# Patient Record
Sex: Male | Born: 1955 | Race: Black or African American | Hispanic: No | Marital: Married | Smoking: Never smoker
Health system: Southern US, Community
[De-identification: ages and names within clinical notes are randomized; demographics above are authoritative.]

## PROBLEM LIST (undated history)

## (undated) DIAGNOSIS — K219 Gastro-esophageal reflux disease without esophagitis: Secondary | ICD-10-CM

## (undated) DIAGNOSIS — J45909 Unspecified asthma, uncomplicated: Secondary | ICD-10-CM

## (undated) HISTORY — DX: Gastro-esophageal reflux disease without esophagitis: K21.9

## (undated) HISTORY — PX: PILONIDAL CYST EXCISION: SHX744

---

## 1997-09-10 ENCOUNTER — Emergency Department (HOSPITAL_COMMUNITY): Admission: EM | Admit: 1997-09-10 | Discharge: 1997-09-10 | Payer: Self-pay | Admitting: Emergency Medicine

## 2012-03-09 ENCOUNTER — Encounter (HOSPITAL_COMMUNITY): Payer: Self-pay | Admitting: *Deleted

## 2012-03-09 ENCOUNTER — Emergency Department (HOSPITAL_COMMUNITY)
Admission: EM | Admit: 2012-03-09 | Discharge: 2012-03-09 | Disposition: A | Discharge status: Home / Self Care | Payer: No Typology Code available for payment source | Attending: Emergency Medicine | Admitting: Emergency Medicine

## 2012-03-09 DIAGNOSIS — S46909A Unspecified injury of unspecified muscle, fascia and tendon at shoulder and upper arm level, unspecified arm, initial encounter: Secondary | ICD-10-CM | POA: Insufficient documentation

## 2012-03-09 DIAGNOSIS — S8990XA Unspecified injury of unspecified lower leg, initial encounter: Secondary | ICD-10-CM | POA: Insufficient documentation

## 2012-03-09 DIAGNOSIS — Y9389 Activity, other specified: Secondary | ICD-10-CM | POA: Insufficient documentation

## 2012-03-09 DIAGNOSIS — S4980XA Other specified injuries of shoulder and upper arm, unspecified arm, initial encounter: Secondary | ICD-10-CM | POA: Insufficient documentation

## 2012-03-09 DIAGNOSIS — J45909 Unspecified asthma, uncomplicated: Secondary | ICD-10-CM | POA: Insufficient documentation

## 2012-03-09 DIAGNOSIS — S0993XA Unspecified injury of face, initial encounter: Secondary | ICD-10-CM | POA: Insufficient documentation

## 2012-03-09 DIAGNOSIS — Z7982 Long term (current) use of aspirin: Secondary | ICD-10-CM | POA: Insufficient documentation

## 2012-03-09 DIAGNOSIS — M7918 Myalgia, other site: Secondary | ICD-10-CM

## 2012-03-09 DIAGNOSIS — Z79899 Other long term (current) drug therapy: Secondary | ICD-10-CM | POA: Insufficient documentation

## 2012-03-09 DIAGNOSIS — S99929A Unspecified injury of unspecified foot, initial encounter: Secondary | ICD-10-CM | POA: Insufficient documentation

## 2012-03-09 DIAGNOSIS — Y92410 Unspecified street and highway as the place of occurrence of the external cause: Secondary | ICD-10-CM | POA: Insufficient documentation

## 2012-03-09 HISTORY — DX: Unspecified asthma, uncomplicated: J45.909

## 2012-03-09 MED ORDER — DIAZEPAM 5 MG PO TABS: 5.0000 mg | ORAL_TABLET | Freq: Three times a day (TID) | ORAL | Status: DC | PRN
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
## 2012-03-09 NOTE — ED Provider Notes (Signed)
Medical screening examination/treatment/procedure(s) were performed by non-physician practitioner and as supervising physician I was immediately available for consultation/collaboration.   Carleene Cooper III, MD 03/09/12 2023

## 2013-02-23 ENCOUNTER — Emergency Department (HOSPITAL_BASED_OUTPATIENT_CLINIC_OR_DEPARTMENT_OTHER)
Admission: EM | Admit: 2013-02-23 | Discharge: 2013-02-23 | Disposition: A | Discharge status: Home / Self Care | Payer: 59 | Attending: Emergency Medicine | Admitting: Emergency Medicine

## 2013-02-23 ENCOUNTER — Encounter (HOSPITAL_BASED_OUTPATIENT_CLINIC_OR_DEPARTMENT_OTHER): Payer: Self-pay | Admitting: Emergency Medicine

## 2013-02-23 DIAGNOSIS — Z79899 Other long term (current) drug therapy: Secondary | ICD-10-CM | POA: Insufficient documentation

## 2013-02-23 DIAGNOSIS — Z792 Long term (current) use of antibiotics: Secondary | ICD-10-CM | POA: Insufficient documentation

## 2013-02-23 DIAGNOSIS — B029 Zoster without complications: Secondary | ICD-10-CM | POA: Insufficient documentation

## 2013-02-23 DIAGNOSIS — Z7982 Long term (current) use of aspirin: Secondary | ICD-10-CM | POA: Insufficient documentation

## 2013-02-23 DIAGNOSIS — J45909 Unspecified asthma, uncomplicated: Secondary | ICD-10-CM | POA: Insufficient documentation

## 2013-02-23 MED ORDER — VALACYCLOVIR HCL 1 G PO TABS: 1000.0000 mg | ORAL_TABLET | Freq: Three times a day (TID) | ORAL | Status: DC

## 2013-02-23 MED ORDER — HYDROCODONE-ACETAMINOPHEN 5-325 MG PO TABS: 1.0000 | ORAL_TABLET | ORAL | Status: DC | PRN

## 2013-02-23 MED ORDER — VALACYCLOVIR HCL 500 MG PO TABS
1000.0000 mg | ORAL_TABLET | Freq: Three times a day (TID) | ORAL | Status: DC
  Filled 2013-02-23: qty 2

## 2013-02-23 NOTE — Discharge Instructions (Signed)
Shingles Shingles (herpes zoster) is an infection that is caused by the same virus that causes chickenpox (varicella). The infection causes a painful skin rash and fluid-filled blisters, which eventually break open, crust over, and heal. It may occur in any area of the body, but it usually affects only one side of the body or face. The pain of shingles usually lasts about 1 month. However, some people with shingles may develop long-term (chronic) pain in the affected area of the body. Shingles often occurs many years after the person had chickenpox. It is more common:  In people older than 50 years.  In people with weakened immune systems, such as those with HIV, AIDS, or cancer.  In people taking medicines that weaken the immune system, such as transplant medicines.  In people under great stress. CAUSES  Shingles is caused by the varicella zoster virus (VZV), which also causes chickenpox. After a person is infected with the virus, it can remain in the person's body for years in an inactive state (dormant). To cause shingles, the virus reactivates and breaks out as an infection in a nerve root. The virus can be spread from person to person (contagious) through contact with open blisters of the shingles rash. It will only spread to people who have not had chickenpox. When these people are exposed to the virus, they may develop chickenpox. They will not develop shingles. Once the blisters scab over, the person is no longer contagious and cannot spread the virus to others. SYMPTOMS  Shingles shows up in stages. The initial symptoms may be pain, itching, and tingling in an area of the skin. This pain is usually described as burning, stabbing, or throbbing.In a few days or weeks, a painful red rash will appear in the area where the pain, itching, and tingling were felt. The rash is usually on one side of the body in a band or belt-like pattern. Then, the rash usually turns into fluid-filled blisters. They  will scab over and dry up in approximately 2 3 weeks. Flu-like symptoms may also occur with the initial symptoms, the rash, or the blisters. These may include:  Fever.  Chills.  Headache.  Upset stomach. DIAGNOSIS  Your caregiver will perform a skin exam to diagnose shingles. Skin scrapings or fluid samples may also be taken from the blisters. This sample will be examined under a microscope or sent to a lab for further testing. TREATMENT  There is no specific cure for shingles. Your caregiver will likely prescribe medicines to help you manage the pain, recover faster, and avoid long-term problems. This may include antiviral drugs, anti-inflammatory drugs, and pain medicines. HOME CARE INSTRUCTIONS   Take a cool bath or apply cool compresses to the area of the rash or blisters as directed. This may help with the pain and itching.   Only take over-the-counter or prescription medicines as directed by your caregiver.   Rest as directed by your caregiver.  Keep your rash and blisters clean with mild soap and cool water or as directed by your caregiver.  Do not pick your blisters or scratch your rash. Apply an anti-itch cream or numbing creams to the affected area as directed by your caregiver.  Keep your shingles rash covered with a loose bandage (dressing).  Avoid skin contact with:  Babies.   Pregnant women.   Children with eczema.   Elderly people with transplants.   People with chronic illnesses, such as leukemia or AIDS.   Wear loose-fitting clothing to help ease   the pain of material rubbing against the rash.  Keep all follow-up appointments with your caregiver.If the area involved is on your face, you may receive a referral for follow-up to a specialist, such as an eye doctor (ophthalmologist) or an ear, nose, and throat (ENT) doctor. Keeping all follow-up appointments will help you avoid eye complications, chronic pain, or disability.  SEEK IMMEDIATE MEDICAL  CARE IF:   You have facial pain, pain around the eye area, or loss of feeling on one side of your face.  You have ear pain or ringing in your ear.  You have loss of taste.  Your pain is not relieved with prescribed medicines.   Your redness or swelling spreads.   You have more pain and swelling.  Your condition is worsening or has changed.   You have a feveror persistent symptoms for more than 2 3 days.  You have a fever and your symptoms suddenly get worse. MAKE SURE YOU:  Understand these instructions.  Will watch your condition.  Will get help right away if you are not doing well or get worse. Document Released: 01/28/2005 Document Revised: 10/23/2011 Document Reviewed: 09/12/2011 [employer] Patient Information 2014 [employer].  

## 2013-02-23 NOTE — ED Notes (Signed)
Vesicular rash from right low back around to right low abdomen x2 days. Burning pain.

## 2013-02-23 NOTE — ED Provider Notes (Signed)
CSN: [id number]     Arrival date & time 02/23/13  2027 History  This chart was scribed for Celene KrasJon R Katriana Dortch, MD by Leone PayorSonum Patel, ED Scribe. This patient was seen in room MH12/MH12 and the patient's care was started 8:55 PM.     Chief Complaint  Patient presents with  . Rash    The history is provided by the patient. No language interpreter was used.    HPI Comments: Thomas Moody is a 58 y.o. male who presents to the Emergency Department complaining of 2 days of gradual onset, gradually worsening, constant rash to the right low back that wraps to right low abdomen. He describes this pain as burning and painful. He denies fever.   Past Medical History  Diagnosis Date  . Asthma    History reviewed. No pertinent past surgical history. No family history on file. History  Substance Use Topics  . Smoking status: Never Smoker   . Smokeless tobacco: Not on file  . Alcohol Use: Yes     Comment: occ    Review of Systems A complete 10 system review of systems was obtained and all systems are negative except as noted in the HPI and PMH.   Allergies  Review of patient's allergies indicates no known allergies.  Home Medications   Current Outpatient Rx  Name  Route  Sig  Dispense  Refill  . albuterol (PROVENTIL HFA;VENTOLIN HFA) 108 (90 BASE) MCG/ACT inhaler   Inhalation   Inhale 2 puffs into the lungs every 6 (six) hours as needed. For shortness of breath         . aspirin 81 MG tablet   Oral   Take 81 mg by mouth daily.         Marland Kitchen. CINNAMON PO   Oral   Take 1 tablet by mouth daily.         . diazepam (VALIUM) 5 MG tablet   Oral   Take 1 tablet (5 mg total) by mouth every 8 (eight) hours as needed.   10 tablet   0   . GARLIC PO   Oral   Take 1 tablet by mouth daily.         Marland Kitchen. HYDROcodone-acetaminophen (NORCO/VICODIN) 5-325 MG per tablet   Oral   Take 1-2 tablets by mouth every 4 (four) hours as needed.   20 tablet   0   . valACYclovir (VALTREX) 1000 MG tablet    Oral   Take 1 tablet (1,000 mg total) by mouth 3 (three) times daily.   21 tablet   0    BP 121/78  Pulse 94  Temp(Src) 98.9 F (37.2 C) (Oral)  Resp 16  Ht 5' 10.5" (1.791 m)  Wt 216 lb (97.977 kg)  BMI 30.54 kg/m2  SpO2 96% Physical Exam  Nursing note and vitals reviewed. Constitutional: He appears well-developed and well-nourished. No distress.  HENT:  Head: Normocephalic and atraumatic.  Right Ear: External ear normal.  Left Ear: External ear normal.  Eyes: Conjunctivae are normal. Right eye exhibits no discharge. Left eye exhibits no discharge. No scleral icterus.  Neck: Neck supple. No tracheal deviation present.  Cardiovascular: Normal rate.   Pulmonary/Chest: Effort normal. No stridor. No respiratory distress.  Musculoskeletal: He exhibits no edema.  Neurological: He is alert. Cranial nerve deficit: no gross deficits.  Skin: Skin is warm and dry. Rash noted. Rash is vesicular.  Vesicular rash in a dermatomal pattern on the right back and abdominal area.  Psychiatric: He has a normal mood and affect.    ED Course  Procedures (including critical care time)  DIAGNOSTIC STUDIES: Oxygen Saturation is 96% on RA, adequate by my interpretation.    COORDINATION OF CARE: 8:55 PM Discussed treatment plan with pt at bedside and pt agreed to plan.   Medications  valACYclovir (VALTREX) tablet 1,000 mg (not administered)     MDM   1. Zoster    Will dc home on valtrex and pain meds.  Follow up with PCP    I personally performed the services described in this documentation, which was scribed in my presence.  The recorded information has been reviewed and is accurate.   Celene Kras, MD 02/23/13 2110

## 2014-04-08 ENCOUNTER — Ambulatory Visit (INDEPENDENT_AMBULATORY_CARE_PROVIDER_SITE_OTHER): Payer: 59 | Admitting: Family Medicine

## 2014-04-08 ENCOUNTER — Encounter: Payer: Self-pay | Admitting: Family Medicine

## 2014-04-08 VITALS — BP 117/75 | HR 80 | Temp 98.5°F | Resp 16 | Ht 71.5 in | Wt 222.0 lb

## 2014-04-08 DIAGNOSIS — Z1383 Encounter for screening for respiratory disorder NEC: Secondary | ICD-10-CM

## 2014-04-08 DIAGNOSIS — Z23 Encounter for immunization: Secondary | ICD-10-CM

## 2014-04-08 DIAGNOSIS — Z113 Encounter for screening for infections with a predominantly sexual mode of transmission: Secondary | ICD-10-CM

## 2014-04-08 DIAGNOSIS — Z136 Encounter for screening for cardiovascular disorders: Secondary | ICD-10-CM

## 2014-04-08 DIAGNOSIS — Z13220 Encounter for screening for lipoid disorders: Secondary | ICD-10-CM

## 2014-04-08 DIAGNOSIS — N401 Enlarged prostate with lower urinary tract symptoms: Secondary | ICD-10-CM

## 2014-04-08 DIAGNOSIS — Z1389 Encounter for screening for other disorder: Secondary | ICD-10-CM

## 2014-04-08 DIAGNOSIS — Z0000 Encounter for general adult medical examination without abnormal findings: Secondary | ICD-10-CM

## 2014-04-08 DIAGNOSIS — Z131 Encounter for screening for diabetes mellitus: Secondary | ICD-10-CM

## 2014-04-08 DIAGNOSIS — R351 Nocturia: Secondary | ICD-10-CM

## 2014-04-08 DIAGNOSIS — Z1329 Encounter for screening for other suspected endocrine disorder: Secondary | ICD-10-CM

## 2014-04-08 DIAGNOSIS — J4520 Mild intermittent asthma, uncomplicated: Secondary | ICD-10-CM

## 2014-04-08 LAB — COMPREHENSIVE METABOLIC PANEL
ALK PHOS: 47 U/L (ref 39–117)
ALT: 29 U/L (ref 0–53)
AST: 21 U/L (ref 0–37)
Albumin: 4.4 g/dL (ref 3.5–5.2)
BUN: 14 mg/dL (ref 6–23)
CHLORIDE: 107 meq/L (ref 96–112)
CO2: 23 meq/L (ref 19–32)
Calcium: 9.5 mg/dL (ref 8.4–10.5)
Creat: 1.18 mg/dL (ref 0.50–1.35)
Glucose, Bld: 78 mg/dL (ref 70–99)
Potassium: 4.5 mEq/L (ref 3.5–5.3)
SODIUM: 142 meq/L (ref 135–145)
TOTAL PROTEIN: 7.1 g/dL (ref 6.0–8.3)
Total Bilirubin: 0.6 mg/dL (ref 0.2–1.2)

## 2014-04-08 LAB — LIPID PANEL
CHOL/HDL RATIO: 3.2 ratio
Cholesterol: 169 mg/dL (ref 0–200)
HDL: 53 mg/dL (ref >=40)
LDL CALC: 103 mg/dL — AB (ref 0–99)
Triglycerides: 66 mg/dL (ref <150)
VLDL: 13 mg/dL (ref 0–40)

## 2014-04-08 LAB — POCT URINALYSIS DIPSTICK
BILIRUBIN UA: NEGATIVE
Glucose, UA: NEGATIVE
KETONES UA: NEGATIVE
LEUKOCYTES UA: NEGATIVE
Nitrite, UA: NEGATIVE
PH UA: 5.5
Protein, UA: NEGATIVE
SPEC GRAV UA: 1.02
Urobilinogen, UA: 0.2

## 2014-04-08 LAB — TSH: TSH: 1.753 u[IU]/mL (ref 0.350–4.500)

## 2014-04-08 LAB — GLUCOSE, POCT (MANUAL RESULT ENTRY): POC Glucose: 84 mg/dl (ref 70–99)

## 2014-04-08 MED ORDER — ALBUTEROL SULFATE HFA 108 (90 BASE) MCG/ACT IN AERS: 1.0000 | INHALATION_SPRAY | RESPIRATORY_TRACT | Status: DC | PRN

## 2014-04-08 MED ORDER — TAMSULOSIN HCL 0.4 MG PO CAPS: 0.4000 mg | ORAL_CAPSULE | Freq: Every day | ORAL | Status: DC

## 2014-04-08 NOTE — Progress Notes (Deleted)
Subjective:  This chart was scribed for Sherren Mocha, MD by Veverly Fells, at [facility]  This patient was seen in room 25 and the patient's care was started at 11:27 AM.   Chief Complaint  Patient presents with   Establish Care     Patient ID: Thomas Moody, male    DOB: January 27, 1956, 59 y.o.   MRN: [id number]  HPI  HPI Comments: Thomas Moody is a 59 y.o. male who presents to [facility] for established care. Patient has not yet eaten breakfast.  His last full physical was about a year ago.  Patient notes that he still suffers from asthma at times (which he has had his whole life).  He uses and inhaler when needed (2-3 times a month)  and has not seen a pulmonologist recently.  Patient has had his flu shot this year.  He is not up to date with his pneumonia vaccine.Patient last had a colonoscopy in 2010 and nothing was found.  Patients last tetanus shot was over 10 years ago.Patient notes that everyone in his family has diabetes but he does not.  He takes cinnamon and garlic as herbal supplements. Patients grandfather and grandmother on his fathers side died of heart attacks (in their 62's.) Patient notes that most of his uncles and aunts died of high blood pressure and heart disease.  Patient notes that he sleeps well and walks as exercise. Patient tries to avoid fried and fatty foods.   Prostate:  Notes he may have a swollen prostate, and gets up 2-3 times a night for urination onset 2 years ago. Patient has never seen his urologist for his prostate and states that his father may have had hypertrophy.  Denies any dysuria or discharge. Patient has had 1 partner in the past year and is married.  Last STD screening was when he had his last physical.   Shingles: Patient had shingles a while ago and does not have it any longer.     Past Medical History  Diagnosis Date   Asthma     Current Outpatient Prescriptions on File Prior to Visit    Medication Sig Dispense Refill   albuterol (PROVENTIL HFA;VENTOLIN HFA) 108 (90 BASE) MCG/ACT inhaler Inhale 2 puffs into the lungs every 6 (six) hours as needed. For shortness of breath     aspirin 81 MG tablet Take 81 mg by mouth daily.     CINNAMON PO Take 1 tablet by mouth daily.     diazepam (VALIUM) 5 MG tablet Take 1 tablet (5 mg total) by mouth every 8 (eight) hours as needed. 10 tablet 0   GARLIC PO Take 1 tablet by mouth daily.     HYDROcodone-acetaminophen (NORCO/VICODIN) 5-325 MG per tablet Take 1-2 tablets by mouth every 4 (four) hours as needed. 20 tablet 0   valACYclovir (VALTREX) 1000 MG tablet Take 1 tablet (1,000 mg total) by mouth 3 (three) times daily. 21 tablet 0   No current facility-administered medications on file prior to visit.    No Known Allergies   Review of Systems  Constitutional: Negative for fever and chills.  HENT: Negative for congestion, ear pain and postnasal drip.   Respiratory: Negative for cough and shortness of breath.   Cardiovascular: Negative for chest pain.  Genitourinary: Positive for urgency, frequency and scrotal swelling. Negative for dysuria, discharge and penile pain.    BP 117/75 mmHg   Pulse 80   Temp(Src) 98.5  F (36.9 C)   Resp 16   Ht 5' 11.5" (1.816 m)   Wt 222 lb (100.699 kg)   BMI 30.53 kg/m2   SpO2 94%     Objective:   Physical Exam  Constitutional: He is oriented to person, place, and time. He appears well-developed and well-nourished. No distress.  HENT:  Head: Normocephalic and atraumatic.  Right Ear: Hearing, tympanic membrane, external ear and ear canal normal.  Left Ear: Hearing, tympanic membrane, external ear and ear canal normal.  Mouth/Throat: Oropharynx is clear and moist and mucous membranes are normal.  Eyes: Conjunctivae and EOM are normal.  Neck: Normal range of motion. Neck supple. No thyromegaly present.  Normal thyroid  Cardiovascular: Normal rate, regular rhythm, S1 normal, S2 normal and  normal heart sounds.   No murmur heard. Pulses:      Carotid pulses are 2+ on the right side, and 2+ on the left side. Pulmonary/Chest: Effort normal and breath sounds normal. No respiratory distress. He has no wheezes. He has no rales.  Lungs clear bilaterally Good air movement  Abdominal: Soft. Normal appearance and bowel sounds are normal. He exhibits no distension. There is no hepatosplenomegaly. There is no tenderness.  Musculoskeletal: Normal range of motion.  Lymphadenopathy:    He has no cervical adenopathy.  Neurological: He is alert and oriented to person, place, and time.  Skin: Skin is warm and dry.  Psychiatric: He has a normal mood and affect. His behavior is normal.  Nursing note and vitals reviewed.   [facility] reading (PRIMARY) by  Dr. Clelia CroftShaw. EKG: NSR, no ischemic changes    Assessment & Plan:   Wellness examination - Plan: EKG 12-Lead, HIV antibody, Lipid panel, Comprehensive metabolic panel, POCT urinalysis dipstick, POCT glucose (manual entry)  BPH associated with nocturia - Plan: PSA, POCT urinalysis dipstick, POCT glucose (manual entry)  Screening for lipid disorders - Plan: Lipid panel  Screening for STD (sexually transmitted disease) - Plan: HIV antibody, Hepatitis C antibody  Screening for cardiovascular, respiratory, and genitourinary diseases - Plan: EKG 12-Lead, Lipid panel, Comprehensive metabolic panel, CBC with Differential/Platelet  Screening for thyroid disorder - Plan: TSH  Asthma, chronic, mild intermittent, uncomplicated - Plan: CBC with Differential/Platelet  Need for prophylactic vaccination with combined diphtheria-tetanus-pertussis (DTP) vaccine - Plan: Tdap vaccine greater than or equal to 7yo IM  Need for prophylactic vaccination against Streptococcus pneumoniae (pneumococcus) - Plan: Pneumococcal polysaccharide vaccine 23-valent greater than or equal to 2yo subcutaneous/IM  Screening for diabetes mellitus - Plan: POCT glucose (manual  entry)  Meds ordered this encounter  Medications   albuterol (PROVENTIL HFA;VENTOLIN HFA) 108 (90 BASE) MCG/ACT inhaler    Sig: Inhale 1-2 puffs into the lungs every 4 (four) hours as needed for wheezing or shortness of breath. For shortness of breath    Dispense:  18 g    Refill:  11   tamsulosin (FLOMAX) 0.4 MG CAPS capsule    Sig: Take 1 capsule (0.4 mg total) by mouth daily.    Dispense:  30 capsule    Refill:  11    I personally performed the services described in this documentation, which was scribed in my presence. The recorded information has been reviewed and considered, and addended by me as needed.  Norberto SorensonEva Shaw, MD MPH    Refill for albuterol  pneumonia vaccine EKG

## 2014-04-08 NOTE — Patient Instructions (Signed)
Cardiac Diet This diet can help prevent heart disease and stroke. Many factors influence your heart health, including eating and exercise habits. Coronary risk rises a lot with abnormal blood fat (lipid) levels. Cardiac meal planning includes limiting unhealthy fats, increasing healthy fats, and making other small dietary changes. General guidelines are as follows:  Adjust calorie intake to reach and maintain desirable body weight.  Limit total fat intake to less than 30% of total calories. Saturated fat should be less than 7% of calories.  Saturated fats are found in animal products and in some vegetable products. Saturated vegetable fats are found in coconut oil, cocoa butter, palm oil, and palm kernel oil. Read labels carefully to avoid these products as much as possible. Use butter in moderation. Choose tub margarines and oils that have 2 grams of fat or less. Good cooking oils are canola and olive oils.  Practice low-fat cooking techniques. Do not fry food. Instead, broil, bake, boil, steam, grill, roast on a rack, stir-fry, or microwave it. Other fat reducing suggestions include:  Remove the skin from poultry.  Remove all visible fat from meats.  Skim the fat off stews, soups, and gravies before serving them.  Steam vegetables in water or broth instead of sauting them in fat.  Avoid foods with trans fat (or hydrogenated oils), such as commercially fried foods and commercially baked goods. Commercial shortening and deep-frying fats will contain trans fat.  Increase intake of fruits, vegetables, whole grains, and legumes to replace foods high in fat.  Increase consumption of nuts, legumes, and seeds to at least 4 servings weekly. One serving of a legume equals  cup, and 1 serving of nuts or seeds equals  cup.  Choose whole grains more often. Have 3 servings per day (a serving is 1 ounce [oz]).  Eat 4 to 5 servings of vegetables per day. A serving of vegetables is 1 cup of raw leafy  vegetables;  cup of raw or cooked cut-up vegetables;  cup of vegetable juice.  Eat 4 to 5 servings of fruit per day. A serving of fruit is 1 medium whole fruit;  cup of dried fruit;  cup of fresh, frozen, or canned fruit;  cup of 100% fruit juice.  Increase your intake of dietary fiber to 20 to 30 grams per day. Insoluble fiber may help lower your risk of heart disease and may help curb your appetite.  Soluble fiber binds cholesterol to be removed from the blood. Foods high in soluble fiber are dried beans, citrus fruits, oats, apples, bananas, broccoli, Brussels sprouts, and eggplant.  Try to include foods fortified with plant sterols or stanols, such as yogurt, breads, juices, or margarines. Choose several fortified foods to achieve a daily intake of 2 to 3 grams of plant sterols or stanols.  Foods with omega-3 fats can help reduce your risk of heart disease. Aim to have a 3.5 oz portion of fatty fish twice per week, such as salmon, mackerel, albacore tuna, sardines, lake trout, or herring. If you wish to take a fish oil supplement, choose one that contains 1 gram of both DHA and EPA.  Limit processed meats to 2 servings (3 oz portion) weekly.  Limit the sodium in your diet to 1500 milligrams (mg) per day. If you have high blood pressure, talk to a registered dietitian about a DASH (Dietary Approaches to Stop Hypertension) eating plan.  Limit sweets and beverages with added sugar, such as soda, to no more than 5 servings per week. One  serving is:   1 tablespoon sugar.  1 tablespoon jelly or jam.   cup sorbet.  1 cup lemonade.   cup regular soda. CHOOSING FOODS Starches  Allowed: Breads: All kinds (wheat, rye, raisin, white, oatmeal, [place], [place], and English muffin bread). Low-fat rolls: English muffins, frankfurter and hamburger buns, bagels, pita bread, tortillas (not fried). Pancakes, waffles, biscuits, and muffins made with recommended oil.  Avoid: Products made with  saturated or trans fats, oils, or whole milk products. Butter rolls, cheese breads, croissants. Commercial doughnuts, muffins, sweet rolls, biscuits, waffles, pancakes, store-bought mixes. Crackers  Allowed: Low-fat crackers and snacks: Animal, graham, rye, saltine (with recommended oil, no lard), oyster, and matzo crackers. Bread sticks, melba toast, rusks, flatbread, pretzels, and light popcorn.  Avoid: High-fat crackers: cheese crackers, butter crackers, and those made with coconut, palm oil, or trans fat (hydrogenated oils). Buttered popcorn. Cereals  Allowed: Hot or cold whole-grain cereals.  Avoid: Cereals containing coconut, hydrogenated vegetable fat, or animal fat. Potatoes / Pasta / Rice  Allowed: All kinds of potatoes, rice, and pasta (such as macaroni, spaghetti, and noodles).  Avoid: Pasta or rice prepared with cream sauce or high-fat cheese. Chow mein noodles, [place] fries. Vegetables  Allowed: All vegetables and vegetable juices.  Avoid: Fried vegetables. Vegetables in cream, butter, or high-fat cheese sauces. Limit coconut. Fruit in cream or custard. Protein  Allowed: Limit your intake of meat, seafood, and poultry to no more than 6 oz (cooked weight) per day. All lean, well-trimmed beef, veal, pork, and lamb. All chicken and [place] without skin. All fish and shellfish. Wild game: wild duck, rabbit, pheasant, and venison. Egg whites or low-cholesterol egg substitutes may be used as desired. Meatless dishes: recipes with dried beans, peas, lentils, and tofu (soybean curd). Seeds and nuts: all seeds and most nuts.  Avoid: Prime grade and other heavily marbled and fatty meats, such as short ribs, spare ribs, rib eye roast or steak, frankfurters, sausage, bacon, and high-fat luncheon meats, mutton. Caviar. Commercially fried fish. Domestic duck, goose, venison sausage. Organ meats: liver, gizzard, heart, chitterlings, brains, kidney, sweetbreads. Dairy  Allowed: Low-fat  cheeses: nonfat or low-fat cottage cheese (1% or 2% fat), cheeses made with part skim milk, such as mozzarella, farmers, string, or ricotta. (Cheeses should be labeled no more than 2 to 6 grams fat per oz.). Skim (or 1%) milk: liquid, powdered, or evaporated. Buttermilk made with low-fat milk. Drinks made with skim or low-fat milk or cocoa. Chocolate milk or cocoa made with skim or low-fat (1%) milk. Nonfat or low-fat yogurt.  Avoid: Whole milk cheeses, including colby, cheddar, [place], Monterey Jack, [place], [place], [place], American, Swiss, and blue. Creamed cottage cheese, cream cheese. Whole milk and whole milk products, including buttermilk or yogurt made from whole milk, drinks made from whole milk. Condensed milk, evaporated whole milk, and 2% milk. Soups and Combination Foods  Allowed: Low-fat low-sodium soups: broth, dehydrated soups, homemade broth, soups with the fat removed, homemade cream soups made with skim or low-fat milk. Low-fat spaghetti, lasagna, chili, and Spanish rice if low-fat ingredients and low-fat cooking techniques are used.  Avoid: Cream soups made with whole milk, cream, or high-fat cheese. All other soups. Desserts and Sweets  Allowed: Sherbet, fruit ices, gelatins, meringues, and angel food cake. Homemade desserts with recommended fats, oils, and milk products. Jam, jelly, honey, marmalade, sugars, and syrups. Pure sugar candy, such as gum drops, hard candy, jelly beans, marshmallows, mints, and small amounts of dark chocolate.  Avoid: Commercially prepared  cakes, pies, cookies, frosting, pudding, or mixes for these products. Desserts containing whole milk products, chocolate, coconut, lard, palm oil, or palm kernel oil. Ice cream or ice cream drinks. Candy that contains chocolate, coconut, butter, hydrogenated fat, or unknown ingredients. Buttered syrups. Fats and Oils  Allowed: Vegetable oils: safflower, sunflower, corn, soybean, cottonseed, sesame, canola, olive,  or peanut. Non-hydrogenated margarines. Salad dressing or mayonnaise: homemade or commercial, made with a recommended oil. Low or nonfat salad dressing or mayonnaise.  Limit added fats and oils to 6 to 8 tsp per day (includes fats used in cooking, baking, salads, and spreads on bread). Remember to count the "hidden fats" in foods.  Avoid: Solid fats and shortenings: butter, lard, salt pork, bacon drippings. Gravy containing meat fat, shortening, or suet. Cocoa butter, coconut. Coconut oil, palm oil, palm kernel oil, or hydrogenated oils: these ingredients are often used in bakery products, nondairy creamers, whipped toppings, candy, and commercially fried foods. Read labels carefully. Salad dressings made of unknown oils, sour cream, or cheese, such as blue cheese and Roquefort. Cream, all kinds: half-and-half, light, heavy, or whipping. Sour cream or cream cheese (even if "light" or low-fat). Nondairy cream substitutes: coffee creamers and sour cream substitutes made with palm, palm kernel, hydrogenated oils, or coconut oil. Beverages  Allowed: Coffee (regular or decaffeinated), tea. Diet carbonated beverages, mineral water. Alcohol: Check with your caregiver. Moderation is recommended.  Avoid: Whole milk, regular sodas, and juice drinks with added sugar. Condiments  Allowed: All seasonings and condiments. Cocoa powder. "Cream" sauces made with recommended ingredients.  Avoid: Carob powder made with hydrogenated fats. SAMPLE MENU Breakfast   cup orange juice   cup oatmeal  1 slice toast  1 tsp margarine  1 cup skim milk Lunch  [place] sandwich with 2 oz [place], 2 slices bread  Lettuce and tomato slices  Fresh fruit  Carrot sticks  Coffee or tea Snack  Fresh fruit or low-fat crackers Dinner  3 oz lean ground beef  1 baked potato  1 tsp margarine   cup asparagus  Lettuce salad  1 tbs non-creamy dressing   cup peach slices  1 cup skim milk Document Released:  11/07/2007 Document Revised: 07/30/2011 Document Reviewed: 03/30/2013 [employer] Patient Information 2015 [place], [place]. This information is not intended to replace advice given to you by your health care provider. Make sure you discuss any questions you have with your health care provider.   Health Maintenance A healthy lifestyle and preventative care can promote health and wellness.  Maintain regular health, dental, and eye exams.  Eat a healthy diet. Foods like vegetables, fruits, whole grains, low-fat dairy products, and lean protein foods contain the nutrients you need and are low in calories. Decrease your intake of foods high in solid fats, added sugars, and salt. Get information about a proper diet from your health care provider, if necessary.  Regular physical exercise is one of the most important things you can do for your health. Most adults should get at least 150 minutes of moderate-intensity exercise (any activity that increases your heart rate and causes you to sweat) each week. In addition, most adults need muscle-strengthening exercises on 2 or more days a week.   Maintain a healthy weight. The body mass index (BMI) is a screening tool to identify possible weight problems. It provides an estimate of body fat based on height and weight. Your health care provider can find your BMI and can help you achieve or maintain a healthy weight. For males  20 years and older:  A BMI below 18.5 is considered underweight.  A BMI of 18.5 to 24.9 is normal.  A BMI of 25 to 29.9 is considered overweight.  A BMI of 30 and above is considered obese.  Maintain normal blood lipids and cholesterol by exercising and minimizing your intake of saturated fat. Eat a balanced diet with plenty of fruits and vegetables. Blood tests for lipids and cholesterol should begin at age 42 and be repeated every 5 years. If your lipid or cholesterol levels are high, you are over age 80, or you are at high risk for  heart disease, you may need your cholesterol levels checked more frequently.Ongoing high lipid and cholesterol levels should be treated with medicines if diet and exercise are not working.  If you smoke, find out from your health care provider how to quit. If you do not use tobacco, do not start.  Lung cancer screening is recommended for adults aged 4-80 years who are at high risk for developing lung cancer because of a history of smoking. A yearly low-dose CT scan of the lungs is recommended for people who have at least a 30-pack-year history of smoking and are current smokers or have quit within the past 15 years. A pack year of smoking is smoking an average of 1 pack of cigarettes a day for 1 year (for example, a 30-pack-year history of smoking could mean smoking 1 pack a day for 30 years or 2 packs a day for 15 years). Yearly screening should continue until the smoker has stopped smoking for at least 15 years. Yearly screening should be stopped for people who develop a health problem that would prevent them from having lung cancer treatment.  If you choose to drink alcohol, do not have more than 2 drinks per day. One drink is considered to be 12 oz (360 mL) of beer, 5 oz (150 mL) of wine, or 1.5 oz (45 mL) of liquor.  Avoid the use of street drugs. Do not share needles with anyone. Ask for help if you need support or instructions about stopping the use of drugs.  High blood pressure causes heart disease and increases the risk of stroke. Blood pressure should be checked at least every 1-2 years. Ongoing high blood pressure should be treated with medicines if weight loss and exercise are not effective.  If you are 89-19 years old, ask your health care provider if you should take aspirin to prevent heart disease.  Diabetes screening involves taking a blood sample to check your fasting blood sugar level. This should be done once every 3 years after age 44 if you are at a normal weight and without  risk factors for diabetes. Testing should be considered at a younger age or be carried out more frequently if you are overweight and have at least 1 risk factor for diabetes.  Colorectal cancer can be detected and often prevented. Most routine colorectal cancer screening begins at the age of 26 and continues through age 84. However, your health care provider may recommend screening at an earlier age if you have risk factors for colon cancer. On a yearly basis, your health care provider may provide home test kits to check for hidden blood in the stool. A small camera at the end of a tube may be used to directly examine the colon (sigmoidoscopy or colonoscopy) to detect the earliest forms of colorectal cancer. Talk to your health care provider about this at age 74 when routine screening  begins. A direct exam of the colon should be repeated every 5-10 years through age 55, unless early forms of precancerous polyps or small growths are found.  People who are at an increased risk for hepatitis B should be screened for this virus. You are considered at high risk for hepatitis B if:  You were born in a country where hepatitis B occurs often. Talk with your health care provider about which countries are considered high risk.  Your parents were born in a high-risk country and you have not received a shot to protect against hepatitis B (hepatitis B vaccine).  You have HIV or AIDS.  You use needles to inject street drugs.  You live with, or have sex with, someone who has hepatitis B.  You are a man who has sex with other men (MSM).  You get hemodialysis treatment.  You take certain medicines for conditions like cancer, organ transplantation, and autoimmune conditions.  Hepatitis C blood testing is recommended for all people born from [birth year] through 1966 and any individual with known risk factors for hepatitis C.  Healthy men should no longer receive prostate-specific antigen (PSA) blood tests as part of  routine cancer screening. Talk to your health care provider about prostate cancer screening.  Testicular cancer screening is not recommended for adolescents or adult males who have no symptoms. Screening includes self-exam, a health care provider exam, and other screening tests. Consult with your health care provider about any symptoms you have or any concerns you have about testicular cancer.  Practice safe sex. Use condoms and avoid high-risk sexual practices to reduce the spread of sexually transmitted infections (STIs).  You should be screened for STIs, including gonorrhea and chlamydia if:  You are sexually active and are younger than 24 years.  You are older than 24 years, and your health care provider tells you that you are at risk for this type of infection.  Your sexual activity has changed since you were last screened, and you are at an increased risk for chlamydia or gonorrhea. Ask your health care provider if you are at risk.  If you are at risk of being infected with HIV, it is recommended that you take a prescription medicine daily to prevent HIV infection. This is called pre-exposure prophylaxis (PrEP). You are considered at risk if:  You are a man who has sex with other men (MSM).  You are a heterosexual man who is sexually active with multiple partners.  You take drugs by injection.  You are sexually active with a partner who has HIV.  Talk with your health care provider about whether you are at high risk of being infected with HIV. If you choose to begin PrEP, you should first be tested for HIV. You should then be tested every 3 months for as long as you are taking PrEP.  Use sunscreen. Apply sunscreen liberally and repeatedly throughout the day. You should seek shade when your shadow is shorter than you. Protect yourself by wearing long sleeves, pants, a wide-brimmed hat, and sunglasses year round whenever you are outdoors.  Tell your health care provider of new moles  or changes in moles, especially if there is a change in shape or color. Also, tell your health care provider if a mole is larger than the size of a pencil eraser.  A one-time screening for abdominal aortic aneurysm (AAA) and surgical repair of large AAAs by ultrasound is recommended for men aged 22-75 years who are current or former smokers.  Stay current with your vaccines (immunizations). Document Released: 07/27/2007 Document Revised: 02/02/2013 Document Reviewed: 06/25/2010 [facility] Patient Information 2015 [place], [place]. This information is not intended to replace advice given to you by your health care provider. Make sure you discuss any questions you have with your health care provider.   Exercise to Stay Healthy Exercise helps you become and stay healthy. EXERCISE IDEAS AND TIPS Choose exercises that:  You enjoy.  Fit into your day. You do not need to exercise really hard to be healthy. You can do exercises at a slow or medium level and stay healthy. You can:  Stretch before and after working out.  Try yoga, Pilates, or tai chi.  Lift weights.  Walk fast, swim, jog, run, climb stairs, bicycle, dance, or rollerskate.  Take aerobic classes. Exercises that burn about 150 calories:  Running 1  miles in 15 minutes.  Playing volleyball for 45 to 60 minutes.  Washing and waxing a car for 45 to 60 minutes.  Playing touch football for 45 minutes.  Walking 1  miles in 35 minutes.  Pushing a stroller 1  miles in 30 minutes.  Playing basketball for 30 minutes.  Raking leaves for 30 minutes.  Bicycling 5 miles in 30 minutes.  Walking 2 miles in 30 minutes.  Dancing for 30 minutes.  Shoveling snow for 15 minutes.  Swimming laps for 20 minutes.  Walking up stairs for 15 minutes.  Bicycling 4 miles in 15 minutes.  Gardening for 30 to 45 minutes.  Jumping rope for 15 minutes.  Washing windows or floors for 45 to 60 minutes. Document Released: 03/02/2010  Document Revised: 04/22/2011 Document Reviewed: 03/02/2010 [facility] Patient Information 2015 [place], [place]. This information is not intended to replace advice given to you by your health care provider. Make sure you discuss any questions you have with your health care provider.    Complementary and Alternative Medical Therapies for Diabetes Complementary and alternative medicines are health care practices or products that are not always accepted as part of routine medicine. Complementary medicine is used along with routine medicine (medical therapy). Alternative medicine can sometimes be used instead of routine medicine. Some people use these methods to treat diabetes. While some of these therapies may be effective, others may not be. Some may even be harmful. Patients using these methods need to tell their caregiver. It is important to let your caregivers know what you are doing. Some of these therapies are discussed below. For more information, talk with your caregiver. THERAPIES Acupuncture Acupuncture is done by a professional who inserts needles into certain points on the skin. Some scientists believe that this triggers the release of the body's natural painkillers. It has been shown to relieve long-term (chronic) pain. This may help patients with painful nerve damage caused by diabetes. Biofeedback Biofeedback helps a person become more aware of the body's response to pain. It also helps you learn to deal with the pain. This alternative therapy focuses on relaxation and stress-reduction techniques. Thinking of peaceful mental images (guided imagery) is one technique. Some people believe these images can ease their condition. MEDICATIONS Chromium Several studies report that chromium supplements may improve diabetes control. Chromium helps insulin improve its action. Research is not yet certain. Supplements have not been recommended or approved. Caution is needed if you have kidney (renal)  problems. Ginseng There are several types of ginseng plants. American ginseng is used for diabetes studies. Those studies have shown some glucose-lowering effects. Those effects have been seen with  fasting and after-meal blood glucose levels. They have also been seen in A1c levels (average blood glucose levels over a 19-month period). More long-term studies are needed before recommendations for use of ginseng can be made. Magnesium Experts have studied the relationship between magnesium and diabetes for many years. But it is not yet fully understood. Studies suggest that a low amount of magnesium may make blood glucose control worse in type 2 diabetes. Research also shows that a low amount may contribute to certain diabetes complications. One study showed that people who consume more magnesium had less risk of type 2 diabetes. Eating whole grains, nuts, and green leafy vegetables raises the magnesium level. Vanadium Vanadium is a compound found in tiny amounts in plants and animals. Early studies showed that vanadium improved blood glucose levels in animals with type 1 and type 2 diabetes. One study found that when given vanadium, those with diabetes were able to decrease their insulin dosage. Researchers still need to learn how it works in the body to discover any side effects, and to find safe dosages. Cinnamon There have been a couple of studies that seem to indicate cinnamon decreases insulin resistance and increases insulin production. By doing so, it may lower blood glucose. Exact doses are unknown, but it may work best when used in combination with other diabetes medicines. Document Released: 11/25/2006 Document Revised: 04/22/2011 Document Reviewed: 12/08/2008 [facility] Patient Information 2015 [place], [place]. This information is not intended to replace advice given to you by your health care provider. Make sure you discuss any questions you have with your health care provider.   Benign Prostatic  Hypertrophy The prostate gland is part of the reproductive system of men. A normal prostate is about the size and shape of a walnut. The prostate gland produces a fluid that is mixed with sperm to make semen. This gland surrounds the urethra and is located in front of the rectum and just below the bladder. The bladder is where urine is stored. The urethra is the tube through which urine passes from the bladder to get out of the body. The prostate grows as a man ages. An enlarged prostate not caused by cancer is called benign prostatic hypertrophy (BPH). An enlarged prostate can press on the urethra. This can make it harder to pass urine. In the early stages of enlargement, the bladder can get by with a narrowed urethra by forcing the urine through. If the problem gets worse, medical or surgical treatment may be required.  This condition should be followed by your health care provider. The accumulation of urine in the bladder can cause infection. Back pressure and infection can progress to bladder damage and kidney (renal) failure. If needed, your health care provider may refer you to a specialist in kidney and prostate disease (urologist). CAUSES  BPH is a common health problem in men older than 50 years. This condition is a normal part of aging. However, not all men will develop problems from this condition. If the enlargement grows away from the urethra, then there will not be any compression of the urethra and resistance to urine flow.If the growth is toward the urethra and compresses it, you will experience difficulty urinating.  SYMPTOMS   Not able to completely empty your bladder.  Getting up often during the night to urinate.  Need to urinate frequently during the day.  Difficultly starting urine flow.  Decrease in size and strength of your urine stream.  Dribbling after urination.  Pain on urination (more  common with infection).  Inability to pass urine. This needs immediate  treatment.  The development of a urinary tract infection. DIAGNOSIS  These tests will help your health care provider understand your problem:  A thorough history and physical examination.  A urination history, with the number of times you urinate, the amounts of urine, the strength of the urine stream, and the feeling of emptiness or fullness after urinating.  A postvoid bladder scan that measures any amount of urine that may remain in your bladder after you finish urinating.  Digital rectal exam. In a rectal exam, your health care provider checks your prostate by putting a gloved, lubricated finger into your rectum to feel the back of your prostate gland. This exam detects the size of your gland and abnormal lumps or growths.  Exam of your urine (urinalysis).  Prostate specific antigen (PSA) screening. This is a blood test used to screen for prostate cancer.  Rectal ultrasonography. This test uses sound waves to electronically produce a picture of your prostate gland. TREATMENT  Once symptoms begin, your health care provider will monitor your condition. Of the men with this condition, one third will have symptoms that stabilize, one third will have symptoms that improve, and one third will have symptoms that progress in the first year. Mild symptoms may not need treatment. Simple observation and yearly exams may be all that is required. Medicines and surgery are options for more severe problems. Your health care provider can help you make an informed decision for what is best. Two classes of medicines are available for relief of prostate symptoms:  Medicines that shrink the prostate. This helps relieve symptoms. These medicines take time to work, and it may be months before any improvement is seen.  Uncommon side effects include problems with sexual function.  Medicines to relax the muscle of the prostate. This also relieves the obstruction by reducing any compression on the  urethra.This group of medicines work much faster than those that reduce the size of the prostate gland. Usually, one can experience improvement in days to weeks..  Side effects can include dizziness, fatigue, lightheadedness, and retrograde ejaculation (diminished volume of ejaculate). Several types of surgical treatments are available for relief of prostate symptoms:  Transurethral resection of the prostate (TURP)--In this treatment, an instrument is inserted through opening at the tip of the penis. It is used to cut away pieces of the inner core of the prostate. The pieces are removed through the same opening of the penis. This removes the obstruction and helps get rid of the symptoms.  Transurethral incision (TUIP)--In this procedure, small cuts are made in the prostate. This lessens the prostates pressure on the urethra.  Transurethral microwave thermotherapy (TUMT)--This procedure uses microwaves to create heat. The heat destroys and removes a small amount of prostate tissue.  Transurethral needle ablation (TUNA)--This is a procedure that uses radio frequencies to do the same as TUMT.  Interstitial laser coagulation (ILC)--This is a procedure that uses a laser to do the same as TUMT and TUNA.  Transurethral electrovaporization (TUVP)--This is a procedure that uses electrodes to do the same as the procedures listed above. SEEK MEDICAL CARE IF:   You develop a fever.  There is unexplained back pain.  Symptoms are not helped by medicines prescribed.  You develop side effects from the medicine you are taking.  Your urine becomes very dark or has a bad smell.  Your lower abdomen becomes distended and you have difficulty passing your urine. SEEK  IMMEDIATE MEDICAL CARE IF:   You are suddenly unable to urinate. This is an emergency. You should be seen immediately.  There are large amounts of blood or clots in the urine.  Your urinary problems become unmanageable.  You develop  lightheadedness, severe dizziness, or you feel faint.  You develop moderate to severe low back or flank pain.  You develop chills or fever. Document Released: 01/28/2005 Document Revised: 02/02/2013 Document Reviewed: 08/13/2012 [facility] Patient Information 2015 [place], [place]. This information is not intended to replace advice given to you by your health care provider. Make sure you discuss any questions you have with your health care provider.

## 2014-04-09 LAB — CBC WITH DIFFERENTIAL/PLATELET
BASOS ABS: 0.1 10*3/uL (ref 0.0–0.1)
Basophils Relative: 2 % — ABNORMAL HIGH (ref 0–1)
EOS PCT: 10 % — AB (ref 0–5)
Eosinophils Absolute: 0.3 10*3/uL (ref 0.0–0.7)
HCT: 46 % (ref 39.0–52.0)
Hemoglobin: 15.8 g/dL (ref 13.0–17.0)
LYMPHS PCT: 22 % (ref 12–46)
Lymphs Abs: 0.7 10*3/uL (ref 0.7–4.0)
MCH: 29.6 pg (ref 26.0–34.0)
MCHC: 34.3 g/dL (ref 30.0–36.0)
MCV: 86.1 fL (ref 78.0–100.0)
MONO ABS: 0.4 10*3/uL (ref 0.1–1.0)
MONOS PCT: 13 % — AB (ref 3–12)
MPV: 9.4 fL (ref 8.6–12.4)
NEUTROS ABS: 1.8 10*3/uL (ref 1.7–7.7)
NEUTROS PCT: 53 % (ref 43–77)
PLATELETS: 265 10*3/uL (ref 150–400)
RBC: 5.34 MIL/uL (ref 4.22–5.81)
RDW: 15.7 % — ABNORMAL HIGH (ref 11.5–15.5)
WBC: 3.4 10*3/uL — ABNORMAL LOW (ref 4.0–10.5)

## 2014-04-09 LAB — HEPATITIS C ANTIBODY: HCV Ab: NEGATIVE

## 2014-04-09 LAB — HIV ANTIBODY (ROUTINE TESTING W REFLEX): HIV: NONREACTIVE

## 2014-04-09 LAB — PSA: PSA: 1.05 ng/mL (ref <=4.00)

## 2014-04-11 ENCOUNTER — Encounter: Payer: Self-pay | Admitting: Family Medicine

## 2014-04-11 NOTE — Progress Notes (Signed)
Subjective:  This chart was scribed for Thomas MochaEva N Paris Chiriboga, MD by Veverly FellsHatice Moody,scribe, at [facility].  This patient was seen in room 25 and the patient's care was started at 11:27 AM.   Chief Complaint  Patient presents with  . Establish Care     Patient ID: Thomas Moody, male    DOB: 05/23/1955, 59 y.o.   MRN: [id number]  HPI  HPI Comments: Thomas Moody is a 59 y.o. male who presents to [facility] for established care. Patient has not yet eaten breakfast.  His last full physical was about a year ago.  Patient notes that he still suffers from asthma at times (which he has had his whole life).  He uses and inhaler when needed (2-3 times a month)  and has not seen a pulmonologist recently.  Patient has had his flu shot this year.  He is not up to date with his pneumonia vaccine.Patient last had a colonoscopy in 2010 and nothing was found.  Patients last tetanus shot was over 10 years ago.Patient notes that everyone in his family has diabetes but he does not.  He takes cinnamon and garlic as herbal supplements. Patients grandfather and grandmother on his fathers side died of heart attacks (in their 4480's.) Patient notes that most of his uncles and aunts died of high blood pressure and heart disease.  Patient notes that he sleeps well and walks as exercise. Patient tries to avoid fried and fatty foods.   Prostate:  Notes he may have a swollen prostate, and gets up 2-3 times a night for urination onset 2 years ago. Patient has never seen his urologist for his prostate and states that his father may have had hypertrophy.  Denies any dysuria or discharge. Patient has had 1 partner in the past year and is married.  Last STD screening was when he had his last physical.   Shingles: Patient had shingles a while ago and does not have it any longer.     Past Medical History  Diagnosis Date  . Asthma   . GERD (gastroesophageal reflux disease)     Current  Outpatient Prescriptions on File Prior to Visit  Medication Sig Dispense Refill  . aspirin 81 MG tablet Take 81 mg by mouth daily.    Marland Kitchen. CINNAMON PO Take 1 tablet by mouth daily.    Marland Kitchen. GARLIC PO Take 1 tablet by mouth daily.     No current facility-administered medications on file prior to visit.    No Known Allergies  Past Surgical History  Procedure Laterality Date  . Pilonidal cyst excision     Family History  Problem Relation Age of Onset  . Heart disease Mother   . Dementia Father   . Hypertension Sister   . Diabetes Son   . Diabetes Son   . Autism Son     middle son   History   Social History  . Marital Status: Married    Spouse Name: N/A  . Number of Children: N/A  . Years of Education: N/A   Social History Main Topics  . Smoking status: Never Smoker   . Smokeless tobacco: Not on file  . Alcohol Use: 0.0 oz/week    0 Standard drinks or equivalent per week     Comment: occ  . Drug Use: No  . Sexual Activity: Yes   Other Topics Concern  . None   Social History Narrative   Married; 3  sons        Review of Systems  Constitutional: Negative for fever and chills.  HENT: Negative for congestion, ear pain and postnasal drip.   Respiratory: Negative for cough and shortness of breath.   Cardiovascular: Negative for chest pain.  Genitourinary: Positive for urgency, frequency and scrotal swelling. Negative for dysuria, discharge and penile pain.    BP 117/75 mmHg  Pulse 80  Temp(Src) 98.5 F (36.9 C)  Resp 16  Ht 5' 11.5" (1.816 m)  Wt 222 lb (100.699 kg)  BMI 30.53 kg/m2  SpO2 94%     Objective:   Physical Exam  Constitutional: He is oriented to person, place, and time. He appears well-developed and well-nourished. No distress.  HENT:  Head: Normocephalic and atraumatic.  Right Ear: Hearing, tympanic membrane, external ear and ear canal normal.  Left Ear: Hearing, tympanic membrane, external ear and ear canal normal.  Mouth/Throat: Oropharynx is  clear and moist and mucous membranes are normal.  Eyes: Conjunctivae and EOM are normal.  Neck: Normal range of motion. Neck supple. No thyromegaly present.  Normal thyroid  Cardiovascular: Normal rate, regular rhythm, S1 normal, S2 normal and normal heart sounds.   No murmur heard. Pulses:      Carotid pulses are 2+ on the right side, and 2+ on the left side. Pulmonary/Chest: Effort normal and breath sounds normal. No respiratory distress. He has no wheezes. He has no rales.  Lungs clear bilaterally Good air movement  Abdominal: Soft. Normal appearance and bowel sounds are normal. He exhibits no distension. There is no hepatosplenomegaly. There is no tenderness.  Musculoskeletal: Normal range of motion.  Lymphadenopathy:    He has no cervical adenopathy.  Neurological: He is alert and oriented to person, place, and time.  Skin: Skin is warm and dry.  Psychiatric: He has a normal mood and affect. His behavior is normal.  Nursing note and vitals reviewed.   [facility] reading (PRIMARY) by  Dr. Clelia Croft. EKG: NSR, no ischemic changes    Assessment & Plan:   Wellness examination - Plan: EKG 12-Lead, HIV antibody, Lipid panel, Comprehensive metabolic panel, POCT urinalysis dipstick, POCT glucose (manual entry)  BPH associated with nocturia - Plan: PSA, POCT urinalysis dipstick, POCT glucose (manual entry) - start trial of flomax  Screening for lipid disorders - Plan: Lipid panel  Screening for STD (sexually transmitted disease) - Plan: HIV antibody, Hepatitis C antibody  Screening for cardiovascular, respiratory, and genitourinary diseases - Plan: EKG 12-Lead, Lipid panel, Comprehensive metabolic panel, CBC with Differential/Platelet  Screening for thyroid disorder - Plan: TSH  Asthma, chronic, mild intermittent, uncomplicated - Plan: CBC with Differential/Platelet  Need for prophylactic vaccination with combined diphtheria-tetanus-pertussis (DTP) vaccine - Plan: Tdap vaccine greater than  or equal to 7yo IM  Need for prophylactic vaccination against Streptococcus pneumoniae (pneumococcus) - Plan: Pneumococcal polysaccharide vaccine 23-valent greater than or equal to 2yo subcutaneous/IM  Screening for diabetes mellitus - Plan: POCT glucose (manual entry)  Meds ordered this encounter  Medications  . albuterol (PROVENTIL HFA;VENTOLIN HFA) 108 (90 BASE) MCG/ACT inhaler    Sig: Inhale 1-2 puffs into the lungs every 4 (four) hours as needed for wheezing or shortness of breath. For shortness of breath    Dispense:  18 g    Refill:  11  . tamsulosin (FLOMAX) 0.4 MG CAPS capsule    Sig: Take 1 capsule (0.4 mg total) by mouth daily.    Dispense:  30 capsule    Refill:  11  I personally performed the services described in this documentation, which was scribed in my presence. The recorded information has been reviewed and considered, and addended by me as needed.  Norberto Sorenson, MD MPH    Refill for albuterol  pneumonia vaccine EKG

## 2016-03-22 ENCOUNTER — Ambulatory Visit (INDEPENDENT_AMBULATORY_CARE_PROVIDER_SITE_OTHER)

## 2016-03-22 ENCOUNTER — Ambulatory Visit (INDEPENDENT_AMBULATORY_CARE_PROVIDER_SITE_OTHER): Admitting: Family Medicine

## 2016-03-22 DIAGNOSIS — M25512 Pain in left shoulder: Secondary | ICD-10-CM

## 2016-03-22 DIAGNOSIS — M4722 Other spondylosis with radiculopathy, cervical region: Secondary | ICD-10-CM | POA: Diagnosis not present

## 2016-03-22 DIAGNOSIS — R29898 Other symptoms and signs involving the musculoskeletal system: Secondary | ICD-10-CM | POA: Diagnosis not present

## 2016-03-22 MED ORDER — MELOXICAM 15 MG PO TABS: 15.0000 mg | ORAL_TABLET | Freq: Every day | ORAL | 1 refills | Status: DC
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
## 2016-03-22 NOTE — Patient Instructions (Addendum)
     IF you received an x-ray today, you will receive an invoice from [facility]. Please contact [facility] at [phone] with questions or concerns regarding your invoice.   IF you received labwork today, you will receive an invoice from [facility]. Please contact [facility] at [phone] with questions or concerns regarding your invoice.   Our billing staff will not be able to assist you with questions regarding bills from these companies.  You will be contacted with the lab results as soon as they are available. The fastest way to get your results is to activate your My Chart account. Instructions are located on the last page of this paperwork. If you have not heard from us regarding the results in 2 weeks, please contact this office.     Musculoskeletal Pain Musculoskeletal pain is muscle and bone aches and pains. This pain can occur in any part of the body. Follow these instructions at home:  Only take medicines for pain, discomfort, or fever as told by your health care provider.  You may continue all activities unless the activities cause more pain. When the pain lessens, slowly resume normal activities. Gradually increase the intensity and duration of the activities or exercise.  During periods of severe pain, bed rest may be helpful. Lie or sit in any position that is comfortable, but get out of bed and walk around at least every several hours.  If directed, put ice on the injured area. ? Put ice in a plastic bag. ? Place a towel between your skin and the bag. ? Leave the ice on for 20 minutes, 2-3 times a day. Contact a health care provider if:  Your pain is getting worse.  Your pain is not relieved with medicines.  You lose function in the area of the pain if the pain is in your arms, legs, or neck. This information is not intended to replace advice given to you by your health care provider. Make sure you discuss any questions you have with your health  care provider. Document Released: 01/28/2005 Document Revised: 07/11/2015 Document Reviewed: 10/02/2012 [employer] Interactive Patient Education  2017 [employer]  

## 2016-04-24 ENCOUNTER — Encounter: Admitting: Family Medicine

## 2018-03-03 ENCOUNTER — Ambulatory Visit (INDEPENDENT_AMBULATORY_CARE_PROVIDER_SITE_OTHER): Payer: Self-pay | Admitting: Family Medicine

## 2018-03-03 ENCOUNTER — Encounter: Payer: Self-pay | Admitting: Family Medicine

## 2018-03-03 VITALS — BP 120/66 | HR 96 | Resp 17 | Ht 72.0 in | Wt 223.4 lb

## 2018-03-03 DIAGNOSIS — Z0001 Encounter for general adult medical examination with abnormal findings: Secondary | ICD-10-CM

## 2018-03-03 DIAGNOSIS — Z131 Encounter for screening for diabetes mellitus: Secondary | ICD-10-CM

## 2018-03-03 DIAGNOSIS — Z0000 Encounter for general adult medical examination without abnormal findings: Secondary | ICD-10-CM

## 2018-03-03 DIAGNOSIS — Z1329 Encounter for screening for other suspected endocrine disorder: Secondary | ICD-10-CM

## 2018-03-03 DIAGNOSIS — E669 Obesity, unspecified: Secondary | ICD-10-CM

## 2018-03-03 DIAGNOSIS — Z7689 Persons encountering health services in other specified circumstances: Secondary | ICD-10-CM

## 2018-03-03 DIAGNOSIS — Z6830 Body mass index (BMI) 30.0-30.9, adult: Secondary | ICD-10-CM

## 2018-03-03 NOTE — Progress Notes (Signed)
Geraldo Queiroz, is a 63 y.o. male  AGT:[id number]  YYQ:[id number]  DOB - 1955/09/10  CC:  Chief Complaint  Patient presents with  . Establish Care    concerned about possibly developing diabetes. PGM had DM & he has 2 sons with DM  . Obesity    is concerned about weight       HPI: Perkins is a 63 y.o. male is here today to establish care, concern for weight and diabetes. Kristion Truddie Hidden has Osteoarthritis of spine with radiculopathy, cervical region on their problem list.   Diabetes concern and obesity Current Body mass index is 30.3 kg/m.  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 Trying to increase intake of vegetables and fruit. Eat most of meals out at fast food. Cut out soda completely. Two children with diabetes. Drinks mostly water and crystal light. During the summer he is more active with performing yard work. No routine screening labs since February 2016. Increase urinary frequency. Urine stream is not weak. Nocturia is new. No polydipsia. Concern for prostate disease. Last PSA normal 1.05, 03/2014. Increase appetite cravings for sugar and sodium. Two meals per day, breakfast and dinner. Endorses snacking on fruits grape,oranges, ritz crackers, graham crackers, and occasional en chips. No alcohol use.   Eye exam: no recent eye exam and endorses decreased visual acuity. Wear corrective lens Dental: last exam 2018.  Colonoscopy: Completed in last 10 days   Current medications: Current Outpatient Medications:  .  aspirin 81 MG tablet, Take 81 mg by mouth daily., Disp: , Rfl:  .  CINNAMON PO, Take 1 tablet by mouth daily., Disp: , Rfl:    Pertinent family medical history: family history includes Autism in his son; Dementia in his father; Diabetes in his paternal grandmother, son, and son; Diabetes type I in his son; Diabetes type II in his son; Heart disease in his mother; Hypertension in his sister.   No Known Allergies  Social History   Socioeconomic History  .  Marital status: Married    Spouse name: Not on file  . Number of children: Not on file  . Years of education: Not on file  . Highest education level: Not on file  Occupational History  . Not on file  Social Needs  . Financial resource strain: Not on file  . Food insecurity:    Worry: Not on file    Inability: Not on file  . Transportation needs:    Medical: Not on file    Non-medical: Not on file  Tobacco Use  . Smoking status: Never Smoker  . Smokeless tobacco: Never Used  Substance and Sexual Activity  . Alcohol use: Yes    Alcohol/week: 0.0 standard drinks    Comment: occ  . Drug use: No  . Sexual activity: Yes  Lifestyle  . Physical activity:    Days per week: Not on file    Minutes per session: Not on file  . Stress: Not on file  Relationships  . Social connections:    Talks on phone: Not on file    Gets together: Not on file    Attends religious service: Not on file    Active member of club or organization: Not on file    Attends meetings of clubs or organizations: Not on file    Relationship status: Not on file  . Intimate partner violence:    Fear of current or ex partner: Not on file    Emotionally abused: Not on file  Physically abused: Not on file    Forced sexual activity: Not on file  Other Topics Concern  . Not on file  Social History Narrative   Married; 3 sons    Review of Systems: Pertinent negatives listed in HPI  Objective:   Vitals:   03/03/18 0922  BP: 120/66  Pulse: 96  Resp: 17  SpO2: 95%    BP Readings from Last 3 Encounters:  03/03/18 120/66  03/22/16 130/80  04/08/14 117/75    Filed Weights   03/03/18 0922  Weight: 223 lb 6.4 oz (101.3 kg)      Physical Exam: Constitutional: Patient appears well-developed and well-nourished. No distress. HENT: Normocephalic, atraumatic, External right and left ear normal. Oropharynx is clear and moist.  Eyes: Conjunctivae and EOM are normal. PERRLA, no scleral icterus. Neck:  Normal ROM. Neck supple. No JVD. No tracheal deviation. No thyromegaly. CVS: RRR, S1/S2 +, no murmurs, no gallops, no carotid bruit.  Pulmonary: Effort and breath sounds normal, no stridor, rhonchi, wheezes, rales.  Abdominal: Soft. BS +, no distension, tenderness, rebound or guarding.  Musculoskeletal: Normal range of motion. No edema and no tenderness.  Neuro: Alert. Normal muscle tone coordination. Normal gait. BUE and BLE strength 5/5. Bilateral hand grips symmetrical. No cranial nerve deficit. Skin: Skin is warm and dry. No rash noted. Not diaphoretic. No erythema. No pallor. Psychiatric: Normal mood and affect. Behavior, judgment, thought content normal.  Lab Results (prior encounters)  Lab Results  Component Value Date   WBC 3.4 (L) 04/08/2014   HGB 15.8 04/08/2014   HCT 46.0 04/08/2014   MCV 86.1 04/08/2014   PLT 265 04/08/2014   Lab Results  Component Value Date   CREATININE 1.18 04/08/2014   BUN 14 04/08/2014   NA 142 04/08/2014   K 4.5 04/08/2014   CL 107 04/08/2014   CO2 23 04/08/2014    No results found for: HGBA1C     Component Value Date/Time   CHOL 169 04/08/2014 1211   TRIG 66 04/08/2014 1211   HDL 53 04/08/2014 1211   CHOLHDL 3.2 04/08/2014 1211   VLDL 13 04/08/2014 1211   LDLCALC 103 (H) 04/08/2014 1211        Assessment and plan:  1. Encounter to establish care  2. BMI 30.0-30.9,adult Encouraged efforts to reduce weight include engaging in physical activity as tolerated with goal of 150 minutes per week. Improve dietary choices and eat a meal regimen consistent with a Mediterranean or DASH diet. Reduce simple carbohydrates. Do not skip meals and eat healthy snacks throughout the day to avoid over-eating at dinner. Set a goal weight loss that is achievable for you.  3. Screening for diabetes mellitus  4. Screening for thyroid disorder   5. Annual physical exam -Age anticipatory guidance provided    Return in AM fasting for A1C, CMP, PSA,  Thyroid, CBC with Differential   Return for schedule fasting labs.   The patient was given clear instructions to go to ER or return to medical center if symptoms don't improve, worsen or new problems develop. The patient verbalized understanding. The patient was advised  to call and obtain lab results if they haven't heard anything from out office within 7-10 business days.  Joaquin Courts, FNP [facility] [address] [place] [address] [phone]: [phone]    This note has been created with Dragon speech recognition software and Paediatric nurse. Any transcriptional errors are unintentional.

## 2018-03-03 NOTE — Patient Instructions (Addendum)
Thank you for choosing [facility] to be your medical home!    Thomas Moody was seen by Kimberly Harris, FNP today.   Lex Sabir Farino's primary care provider is Harris, Kimberly S, FNP.   For the best care possible, you should try to see Kimberly Harris, FNP-C whenever you come to the clinic.   We look forward to seeing you again soon!  If you have any questions about your visit today, please call us at [phone] or feel free to reach your primary care provider via MyChart.     Preventive Care 40-64 Years, Male Preventive care refers to lifestyle choices and visits with your health care provider that can promote health and wellness. What does preventive care include?   A yearly physical exam. This is also called an annual well check.  Dental exams once or twice a year.  Routine eye exams. Ask your health care provider how often you should have your eyes checked.  Personal lifestyle choices, including: ? Daily care of your teeth and gums. ? Regular physical activity. ? Eating a healthy diet. ? Avoiding tobacco and drug use. ? Limiting alcohol use. ? Practicing safe sex. ? Taking low-dose aspirin every day starting at age 50. What happens during an annual well check? The services and screenings done by your health care provider during your annual well check will depend on your age, overall health, lifestyle risk factors, and family history of disease. Counseling Your health care provider may ask you questions about your:  Alcohol use.  Tobacco use.  Drug use.  Emotional well-being.  Home and relationship well-being.  Sexual activity.  Eating habits.  Work and work environment. Screening You may have the following tests or measurements:  Height, weight, and BMI.  Blood pressure.  Lipid and cholesterol levels. These may be checked every 5 years, or more frequently if you are over 50 years old.  Skin check.  Lung cancer screening.  You may have this screening every year starting at age 55 if you have a 30-pack-year history of smoking and currently smoke or have quit within the past 15 years.  Colorectal cancer screening. All adults should have this screening starting at age 50 and continuing until age 75. Your health care provider may recommend screening at age 45. You will have tests every 1-10 years, depending on your results and the type of screening test. People at increased risk should start screening at an earlier age. Screening tests may include: ? Guaiac-based fecal occult blood testing. ? Fecal immunochemical test (FIT). ? Stool DNA test. ? Virtual colonoscopy. ? Sigmoidoscopy. During this test, a flexible tube with a tiny camera (sigmoidoscope) is used to examine your rectum and lower colon. The sigmoidoscope is inserted through your anus into your rectum and lower colon. ? Colonoscopy. During this test, a long, thin, flexible tube with a tiny camera (colonoscope) is used to examine your entire colon and rectum.  Prostate cancer screening. Recommendations will vary depending on your family history and other risks.  Hepatitis C blood test.  Hepatitis B blood test.  Sexually transmitted disease (STD) testing.  Diabetes screening. This is done by checking your blood sugar (glucose) after you have not eaten for a while (fasting). You may have this done every 1-3 years. Discuss your test results, treatment options, and if necessary, the need for more tests with your health care provider. Vaccines Your health care provider may recommend certain vaccines, such as:  Influenza vaccine. This   is recommended every year.  Tetanus, diphtheria, and acellular pertussis (Tdap, Td) vaccine. You may need a Td booster every 10 years.  Varicella vaccine. You may need this if you have not been vaccinated.  Zoster vaccine. You may need this after age 71.  Measles, mumps, and rubella (MMR) vaccine. You may need at least one  dose of MMR if you were born in 1957 or later. You may also need a second dose.  Pneumococcal 13-valent conjugate (PCV13) vaccine. You may need this if you have certain conditions and have not been vaccinated.  Pneumococcal polysaccharide (PPSV23) vaccine. You may need one or two doses if you smoke cigarettes or if you have certain conditions.  Meningococcal vaccine. You may need this if you have certain conditions.  Hepatitis A vaccine. You may need this if you have certain conditions or if you travel or work in places where you may be exposed to hepatitis A.  Hepatitis B vaccine. You may need this if you have certain conditions or if you travel or work in places where you may be exposed to hepatitis B.  Haemophilus influenzae type b (Hib) vaccine. You may need this if you have certain risk factors. Talk to your health care provider about which screenings and vaccines you need and how often you need them. This information is not intended to replace advice given to you by your health care provider. Make sure you discuss any questions you have with your health care provider. Document Released: 02/24/2015 Document Revised: 03/20/2017 Document Reviewed: 11/29/2014 Elsevier Interactive Patient Education  2019 [employer].

## 2018-03-04 ENCOUNTER — Other Ambulatory Visit: Payer: Self-pay

## 2018-03-04 ENCOUNTER — Ambulatory Visit: Payer: Self-pay

## 2018-03-04 DIAGNOSIS — Z1329 Encounter for screening for other suspected endocrine disorder: Secondary | ICD-10-CM

## 2018-03-04 DIAGNOSIS — Z125 Encounter for screening for malignant neoplasm of prostate: Secondary | ICD-10-CM

## 2018-03-04 DIAGNOSIS — Z130 Encounter for screening for diseases of the blood and blood-forming organs and certain disorders involving the immune mechanism: Secondary | ICD-10-CM

## 2018-03-04 DIAGNOSIS — Z131 Encounter for screening for diabetes mellitus: Secondary | ICD-10-CM

## 2018-03-04 DIAGNOSIS — Z13220 Encounter for screening for lipoid disorders: Secondary | ICD-10-CM

## 2018-03-04 DIAGNOSIS — Z1389 Encounter for screening for other disorder: Secondary | ICD-10-CM

## 2018-03-04 LAB — POCT URINALYSIS DIP (CLINITEK)
Bilirubin, UA: NEGATIVE
Glucose, UA: NEGATIVE mg/dL
Ketones, POC UA: NEGATIVE mg/dL
Leukocytes, UA: NEGATIVE
NITRITE UA: NEGATIVE
POC PROTEIN,UA: NEGATIVE
Spec Grav, UA: >=1.03 — AB (ref 1.010–1.025)
UROBILINOGEN UA: 0.2 U/dL
pH, UA: 5.5 (ref 5.0–8.0)

## 2018-03-05 LAB — COMPREHENSIVE METABOLIC PANEL
ALK PHOS: 54 IU/L (ref 39–117)
ALT: 25 IU/L (ref 0–44)
AST: 15 IU/L (ref 0–40)
Albumin/Globulin Ratio: 1.6 (ref 1.2–2.2)
Albumin: 4.2 g/dL (ref 3.8–4.8)
BUN/Creatinine Ratio: 9 — ABNORMAL LOW (ref 10–24)
BUN: 10 mg/dL (ref 8–27)
Bilirubin Total: 0.5 mg/dL (ref 0.0–1.2)
CALCIUM: 9.2 mg/dL (ref 8.6–10.2)
CO2: 20 mmol/L (ref 20–29)
CREATININE: 1.14 mg/dL (ref 0.76–1.27)
Chloride: 106 mmol/L (ref 96–106)
GFR calc Af Amer: 79 mL/min/{1.73_m2} (ref >59)
GFR, EST NON AFRICAN AMERICAN: 68 mL/min/{1.73_m2} (ref >59)
GLUCOSE: 91 mg/dL (ref 65–99)
Globulin, Total: 2.7 g/dL (ref 1.5–4.5)
Potassium: 4 mmol/L (ref 3.5–5.2)
Sodium: 143 mmol/L (ref 134–144)
Total Protein: 6.9 g/dL (ref 6.0–8.5)

## 2018-03-05 LAB — CBC WITH DIFFERENTIAL/PLATELET
BASOS: 3 %
Basophils Absolute: 0.1 10*3/uL (ref 0.0–0.2)
EOS (ABSOLUTE): 0.3 10*3/uL (ref 0.0–0.4)
EOS: 12 %
HEMATOCRIT: 43.1 % (ref 37.5–51.0)
Hemoglobin: 14.7 g/dL (ref 13.0–17.7)
IMMATURE GRANS (ABS): 0 10*3/uL (ref 0.0–0.1)
IMMATURE GRANULOCYTES: 0 %
LYMPHS: 21 %
Lymphocytes Absolute: 0.6 10*3/uL — ABNORMAL LOW (ref 0.7–3.1)
MCH: 30 pg (ref 26.6–33.0)
MCHC: 34.1 g/dL (ref 31.5–35.7)
MCV: 88 fL (ref 79–97)
Monocytes Absolute: 0.4 10*3/uL (ref 0.1–0.9)
Monocytes: 13 %
NEUTROS PCT: 51 %
Neutrophils Absolute: 1.4 10*3/uL (ref 1.4–7.0)
PLATELETS: 256 10*3/uL (ref 150–450)
RBC: 4.9 x10E6/uL (ref 4.14–5.80)
RDW: 14.5 % (ref 11.6–15.4)
WBC: 2.8 10*3/uL — AB (ref 3.4–10.8)

## 2018-03-05 LAB — LIPID PANEL
CHOL/HDL RATIO: 3.2 ratio (ref 0.0–5.0)
Cholesterol, Total: 172 mg/dL (ref 100–199)
HDL: 54 mg/dL (ref >39)
LDL Calculated: 106 mg/dL — ABNORMAL HIGH (ref 0–99)
Triglycerides: 61 mg/dL (ref 0–149)
VLDL CHOLESTEROL CAL: 12 mg/dL (ref 5–40)

## 2018-03-05 LAB — PSA: Prostate Specific Ag, Serum: 1.2 ng/mL (ref 0.0–4.0)

## 2018-03-05 LAB — TSH: TSH: 1.33 u[IU]/mL (ref 0.450–4.500)

## 2018-03-05 LAB — HEMOGLOBIN A1C
Est. average glucose Bld gHb Est-mCnc: 128 mg/dL
HEMOGLOBIN A1C: 6.1 % — AB (ref 4.8–5.6)

## 2018-03-11 ENCOUNTER — Telehealth: Payer: Self-pay | Admitting: Family Medicine

## 2018-03-11 NOTE — Telephone Encounter (Signed)
Patient called requesting lab results, please follow up °

## 2018-03-17 NOTE — Progress Notes (Signed)
Patient notified of results & recommendations. Expressed understanding. Made follow up appointment for 08/31/2018 @ 10:10 AM.

## 2018-08-04 ENCOUNTER — Other Ambulatory Visit: Payer: Self-pay | Admitting: Internal Medicine

## 2018-08-04 DIAGNOSIS — Z20822 Contact with and (suspected) exposure to covid-19: Secondary | ICD-10-CM

## 2018-08-09 LAB — NOVEL CORONAVIRUS, NAA: SARS-CoV-2, NAA: NOT DETECTED

## 2018-08-28 ENCOUNTER — Telehealth: Payer: Self-pay

## 2018-08-28 NOTE — Telephone Encounter (Signed)
Called patient to do their pre-visit COVID screening.  Call went to voicemail. Unable to do prescreening.  

## 2018-08-31 ENCOUNTER — Encounter: Payer: Self-pay | Admitting: Family Medicine

## 2018-08-31 ENCOUNTER — Other Ambulatory Visit: Payer: Self-pay

## 2018-08-31 ENCOUNTER — Ambulatory Visit (INDEPENDENT_AMBULATORY_CARE_PROVIDER_SITE_OTHER): Payer: Self-pay | Admitting: Family Medicine

## 2018-08-31 VITALS — BP 127/74 | HR 88 | Temp 97.5°F | Resp 17 | Ht 72.0 in | Wt 240.0 lb

## 2018-08-31 DIAGNOSIS — R5383 Other fatigue: Secondary | ICD-10-CM

## 2018-08-31 DIAGNOSIS — D72819 Decreased white blood cell count, unspecified: Secondary | ICD-10-CM

## 2018-08-31 DIAGNOSIS — R7303 Prediabetes: Secondary | ICD-10-CM

## 2018-08-31 DIAGNOSIS — E669 Obesity, unspecified: Secondary | ICD-10-CM

## 2018-08-31 DIAGNOSIS — E66811 Obesity, class 1: Secondary | ICD-10-CM

## 2018-08-31 DIAGNOSIS — Z1211 Encounter for screening for malignant neoplasm of colon: Secondary | ICD-10-CM

## 2018-08-31 NOTE — Progress Notes (Signed)
Established Patient Office Visit  Subjective:  Patient ID: Thomas Moody, male    DOB: 19-Oct-1955  Age: 63 y.o. MRN: [id number]  CC:  Chief Complaint  Patient presents with  . Prediabetes  . Hyperlipidemia    HPI Thomas Moody presents for follow-up of prediabetes.  Patient with hemoglobin A1c of 6.1 on 03/04/2018.  Patient also requested lipid panel due to an LDL of 106 also on 03/05/2023 patient has had breakfast prior to today's appointment as well as sweetened iced tea.  Patient does have some issues with increased thirst as well as frequent urination.  He reports that he would also like to have Cologuard screening done for colon cancer.  He has had a prior colonoscopy but denies any abnormalities when his colonoscopy was done.  Patient did not have any removal of polyps and he denies any family history of colon cancer.  He denies any current issues with blood in the stool or black stools.  No current abdominal pain.  He does have history of chronic low back pain which he believes is related to his prior [employer] service as well as working for the [employer] and [employer].  He is currently not on any prescription medications.  Patient takes aspirin 81 mg daily and over-the-counter cinnamon.  He is also interested in having his testosterone level checked secondary to fatigue and weight gain.  Past Medical History:  Diagnosis Date  . Asthma   . GERD (gastroesophageal reflux disease)     Past Surgical History:  Procedure Laterality Date  . PILONIDAL CYST EXCISION      Family History  Problem Relation Age of Onset  . Heart disease Mother   . Dementia Father   . Hypertension Sister   . Diabetes Son   . Diabetes type I Son   . Diabetes Paternal Grandmother   . Diabetes Son   . Diabetes type II Son   . Autism Son        middle son   Social History   Tobacco Use  . Smoking status: Never Smoker  . Smokeless tobacco: Never Used  Substance Use Topics  .  Alcohol use: Yes    Alcohol/week: 0.0 standard drinks    Comment: occ  . Drug use: No    Outpatient Medications Prior to Visit  Medication Sig Dispense Refill  . aspirin 81 MG tablet Take 81 mg by mouth daily.    Marland Kitchen CINNAMON PO Take 1 tablet by mouth daily.     No facility-administered medications prior to visit.     No Known Allergies  ROS Review of Systems  Constitutional: Positive for fatigue. Negative for chills and fever.  HENT: Negative for sore throat and trouble swallowing.   Eyes: Negative for photophobia and visual disturbance.  Respiratory: Negative for cough and shortness of breath.   Cardiovascular: Negative for chest pain, palpitations and leg swelling.  Gastrointestinal: Negative for abdominal pain, blood in stool, constipation, diarrhea, nausea and rectal pain.  Endocrine: Positive for polydipsia and polyuria. Negative for polyphagia.  Genitourinary: Positive for frequency. Negative for dysuria.  Musculoskeletal: Positive for back pain. Negative for gait problem.  Neurological: Negative for dizziness and headaches.  Hematological: Negative for adenopathy. Does not bruise/bleed easily.      Objective:    Physical Exam  Constitutional: He is oriented to person, place, and time. He appears well-developed and well-nourished.  Well-nourished well-developed older overweight/obese male in no acute distress  Neck: Normal range of motion.  Neck supple. No JVD present. No thyromegaly present.  Cardiovascular: Normal rate and regular rhythm.  No carotid bruit  Pulmonary/Chest: Effort normal and breath sounds normal.  Abdominal: Soft. There is no abdominal tenderness. There is no rebound and no guarding.  Musculoskeletal: Normal range of motion.        General: Tenderness (Lumbosacral tenderness to palpation.  Patient also with possible mild kyphosis of the upper back) present. No edema.  Lymphadenopathy:    He has no cervical adenopathy.  Neurological: He is alert and  oriented to person, place, and time.  Skin: Skin is warm and dry.  Psychiatric: He has a normal mood and affect. His behavior is normal.  Nursing note and vitals reviewed.   BP 127/74   Pulse 88   Temp (!) 97.5 F (36.4 C) (Temporal)   Resp 17   Ht 6' (1.829 m)   Wt 240 lb (108.9 kg)   SpO2 95%   BMI 32.55 kg/m  Wt Readings from Last 3 Encounters:  08/31/18 240 lb (108.9 kg)  03/03/18 223 lb 6.4 oz (101.3 kg)  03/22/16 234 lb (106.1 kg)     Health Maintenance Due  Topic Date Due  . COLONOSCOPY  08/13/2004    There are no preventive care reminders to display for this patient.  Lab Results  Component Value Date   TSH 1.330 03/04/2018   Lab Results  Component Value Date   WBC 2.8 (L) 03/04/2018   HGB 14.7 03/04/2018   HCT 43.1 03/04/2018   MCV 88 03/04/2018   PLT 256 03/04/2018   Lab Results  Component Value Date   NA 143 03/04/2018   K 4.0 03/04/2018   CO2 20 03/04/2018   GLUCOSE 91 03/04/2018   BUN 10 03/04/2018   CREATININE 1.14 03/04/2018   BILITOT 0.5 03/04/2018   ALKPHOS 54 03/04/2018   AST 15 03/04/2018   ALT 25 03/04/2018   PROT 6.9 03/04/2018   ALBUMIN 4.2 03/04/2018   CALCIUM 9.2 03/04/2018   Lab Results  Component Value Date   CHOL 172 03/04/2018   Lab Results  Component Value Date   HDL 54 03/04/2018   Lab Results  Component Value Date   LDLCALC 106 (H) 03/04/2018   Lab Results  Component Value Date   TRIG 61 03/04/2018   Lab Results  Component Value Date   CHOLHDL 3.2 03/04/2018   Lab Results  Component Value Date   HGBA1C 6.1 (H) 03/04/2018      Assessment & Plan:  1. Prediabetes; 4. Fatigue Patient with prediabetes with hemoglobin A1c of 6.1 in January.  Patient has not made any dietary changes and is not engaging in regular exercise.  I suspect that his hemoglobin A1c might be higher.  Hemoglobin A1c and BMP will be repeated at today's visit.  Patient also requests testosterone level.  If less than 278, he may be a  candidate for testosterone replacement therapy.  Information provided regarding diet information for prediabetes as well as information on preventing type 2 diabetes as part of the after visit summary. - Hemoglobin A1c - Testosterone - BMP  2. Leukopenia, unspecified type Patient with leukopenia on prior blood work in January of this year with white blood cell count of 2.8.  We will repeat CBC at today's visit in follow-up of fatigue as well as leukopenia - CBC with Differential  3. Obesity (BMI 30.0-34.9) Patient with obesity and prediabetes.  Information given on preventing type 2 diabetes and diet for prediabetes.  Will check testosterone level due to patient's complaint of fatigue as well as his obesity. - Testosterone  4. Screening for colon cancer Order placed for Cologuard per patient's request as a screening test for colon cancer.  Reviewed with the patient that this test is not intended for high risk patients and he denies any prior polyps on colonoscopy, no rectal bleeding, no black stools and no family history of colon cancer.  An After Visit Summary was printed and given to the patient.  Follow-up: An After Visit Summary was printed and given to the patient.    Cain Saupeammie Dakota Vanwart, MD

## 2018-08-31 NOTE — Patient Instructions (Signed)
Preventing Type 2 Diabetes Mellitus Type 2 diabetes (type 2 diabetes mellitus) is a long-term (chronic) disease that affects blood sugar (glucose) levels. Normally, a hormone called insulin allows glucose to enter cells in the body. The cells use glucose for energy. In type 2 diabetes, one or both of these problems may be present:  The body does not make enough insulin.  The body does not respond properly to insulin that it makes (insulin resistance). Insulin resistance or lack of insulin causes excess glucose to build up in the blood instead of going into cells. As a result, high blood glucose (hyperglycemia) develops, which can cause many complications. Being overweight or obese and having an inactive (sedentary) lifestyle can increase your risk for diabetes. Type 2 diabetes can be delayed or prevented by making certain nutrition and lifestyle changes. What nutrition changes can be made?   Eat healthy meals and snacks regularly. Keep a healthy snack with you for when you get hungry between meals, such as fruit or a handful of nuts.  Eat lean meats and proteins that are low in saturated fats, such as chicken, fish, egg whites, and beans. Avoid processed meats.  Eat plenty of fruits and vegetables and plenty of grains that have not been processed (whole grains). It is recommended that you eat: ? 1?2 cups of fruit every day. ? 2?3 cups of vegetables every day. ? 6?8 oz of whole grains every day, such as oats, whole wheat, bulgur, brown rice, quinoa, and millet.  Eat low-fat dairy products, such as milk, yogurt, and cheese.  Eat foods that contain healthy fats, such as nuts, avocado, olive oil, and canola oil.  Drink water throughout the day. Avoid drinks that contain added sugar, such as soda or sweet tea.  Follow instructions from your health care provider about specific eating or drinking restrictions.  Control how much food you eat at a time (portion size). ? Check food labels to find  out the serving sizes of foods. ? Use a kitchen scale to weigh amounts of foods.  Saute or steam food instead of frying it. Cook with water or broth instead of oils or butter.  Limit your intake of: ? Salt (sodium). Have no more than 1 tsp (2,400 mg) of sodium a day. If you have heart disease or high blood pressure, have less than ? tsp (1,500 mg) of sodium a day. ? Saturated fat. This is fat that is solid at room temperature, such as butter or fat on meat. What lifestyle changes can be made? Activity   Do moderate-intensity physical activity for at least 30 minutes on at least 5 days of the week, or as much as told by your health care provider.  Ask your health care provider what activities are safe for you. A mix of physical activities may be best, such as walking, swimming, cycling, and strength training.  Try to add physical activity into your day. For example: ? Park in spots that are farther away than usual, so that you walk more. For example, park in a far corner of the parking lot when you go to the office or the grocery store. ? Take a walk during your lunch break. ? Use stairs instead of elevators or escalators. Weight Loss  Lose weight as directed. Your health care provider can determine how much weight loss is best for you and can help you lose weight safely.  If you are overweight or obese, you may be instructed to lose at least 5?7 %   of your body weight. Alcohol and Tobacco   Limit alcohol intake to no more than 1 drink a day for nonpregnant women and 2 drinks a day for men. One drink equals 12 oz of beer, 5 oz of wine, or 1 oz of hard liquor.  Do not use any tobacco products, such as cigarettes, chewing tobacco, and e-cigarettes. If you need help quitting, ask your health care provider. Work With [facility] Provider  Have your blood glucose tested regularly, as told by your health care provider.  Discuss your risk factors and how you can reduce your risk for  diabetes.  Get screening tests as told by your health care provider. You may have screening tests regularly, especially if you have certain risk factors for type 2 diabetes.  Make an appointment with a diet and nutrition specialist (registered dietitian). A registered dietitian can help you make a healthy eating plan and can help you understand portion sizes and food labels. Why are these changes important?  It is possible to prevent or delay type 2 diabetes and related health problems by making lifestyle and nutrition changes.  It can be difficult to recognize signs of type 2 diabetes. The best way to avoid possible damage to your body is to take actions to prevent the disease before you develop symptoms. What can happen if changes are not made?  Your blood glucose levels may keep increasing. Having high blood glucose for a long time is dangerous. Too much glucose in your blood can damage your blood vessels, heart, kidneys, nerves, and eyes.  You may develop prediabetes or type 2 diabetes. Type 2 diabetes can lead to many chronic health problems and complications, such as: ? Heart disease. ? Stroke. ? Blindness. ? Kidney disease. ? Depression. ? Poor circulation in the feet and legs, which could lead to surgical removal (amputation) in severe cases. Where to find support  Ask your health care provider to recommend a registered dietitian, diabetes educator, or weight loss program.  Look for local or online weight loss groups.  Join a gym, fitness club, or outdoor activity group, such as a walking club. Where to find more information To learn more about diabetes and diabetes prevention, visit:  American Diabetes Association (ADA): www.diabetes.[employer] of Diabetes and Digestive and Kidney Diseases: FindSpin.nl To learn more about healthy eating, visit:  The U.S. Department of Agriculture Scientist, research (physical sciences)), Choose My Plate:  http://wiley-williams.com/  Office of Disease Prevention and Health Promotion (ODPHP), Dietary Guidelines: SurferLive.at Summary  You can reduce your risk for type 2 diabetes by increasing your physical activity, eating healthy foods, and losing weight as directed.  Talk with your health care provider about your risk for type 2 diabetes. Ask about any blood tests or screening tests that you need to have. This information is not intended to replace advice given to you by your health care provider. Make sure you discuss any questions you have with your health care provider. Document Released: 05/22/2015 Document Revised: 05/22/2018 Document Reviewed: 03/21/2015 Elsevier Patient Education  2020 [place].  Prediabetes Eating Plan Prediabetes is a condition that causes blood sugar (glucose) levels to be higher than normal. This increases the risk for developing diabetes. In order to prevent diabetes from developing, your health care provider may recommend a diet and other lifestyle changes to help you:  Control your blood glucose levels.  Improve your cholesterol levels.  Manage your blood pressure. Your health care provider may recommend working with a diet and  nutrition specialist (dietitian) to make a meal plan that is best for you. What are tips for following this plan? Lifestyle  Set weight loss goals with the help of your health care team. It is recommended that most people with prediabetes lose 7% of their current body weight.  Exercise for at least 30 minutes at least 5 days a week.  Attend a support group or seek ongoing support from a mental health counselor.  Take over-the-counter and prescription medicines only as told by your health care provider. Reading food labels  Read food labels to check the amount of fat, salt (sodium), and sugar in prepackaged foods. Avoid foods that have: ? Saturated fats. ? Trans fats. ? Added sugars.  Avoid foods  that have more than 300 milligrams (mg) of sodium per serving. Limit your daily sodium intake to less than 2,300 mg each day. Shopping  Avoid buying pre-made and processed foods. Cooking  Cook with olive oil. Do not use butter, lard, or ghee.  Bake, broil, grill, or boil foods. Avoid frying. Meal planning   Work with your dietitian to develop an eating plan that is right for you. This may include: ? Tracking how many calories you take in. Use a food diary, notebook, or mobile application to track what you eat at each meal. ? Using the glycemic index (GI) to plan your meals. The index tells you how quickly a food will raise your blood glucose. Choose low-GI foods. These foods take a longer time to raise blood glucose.  Consider following a Mediterranean diet. This diet includes: ? Several servings each day of fresh fruits and vegetables. ? Eating fish at least twice a week. ? Several servings each day of whole grains, beans, nuts, and seeds. ? Using olive oil instead of other fats. ? Moderate alcohol consumption. ? Eating small amounts of red meat and whole-fat dairy.  If you have high blood pressure, you may need to limit your sodium intake or follow a diet such as the DASH eating plan. DASH is an eating plan that aims to lower high blood pressure. What foods are recommended? The items listed below may not be a complete list. Talk with your dietitian about what dietary choices are best for you. Grains Whole grains, such as whole-wheat or whole-grain breads, crackers, cereals, and pasta. Unsweetened oatmeal. Bulgur. Barley. Quinoa. Brown rice. Corn or whole-wheat flour tortillas or taco shells. Vegetables Lettuce. Spinach. Peas. Beets. Cauliflower. Cabbage. Broccoli. Carrots. Tomatoes. Squash. Eggplant. Herbs. Peppers. Onions. Cucumbers. Brussels sprouts. Fruits Berries. Bananas. Apples. Oranges. Grapes. Papaya. Mango. Pomegranate. Kiwi. Grapefruit. Cherries. Meats and other protein  foods Seafood. Poultry without skin. Lean cuts of pork and beef. Tofu. Eggs. Nuts. Beans. Dairy Low-fat or fat-free dairy products, such as yogurt, cottage cheese, and cheese. Beverages Water. Tea. Coffee. Sugar-free or diet soda. Seltzer water. Lowfat or no-fat milk. Milk alternatives, such as soy or almond milk. Fats and oils Olive oil. Canola oil. Sunflower oil. Grapeseed oil. Avocado. Walnuts. Sweets and desserts Sugar-free or low-fat pudding. Sugar-free or low-fat ice cream and other frozen treats. Seasoning and other foods Herbs. Sodium-free spices. Mustard. Relish. Low-fat, low-sugar ketchup. Low-fat, low-sugar barbecue sauce. Low-fat or fat-free mayonnaise. What foods are not recommended? The items listed below may not be a complete list. Talk with your dietitian about what dietary choices are best for you. Grains Refined white flour and flour products, such as bread, pasta, snack foods, and cereals. Vegetables Canned vegetables. Frozen vegetables with butter or cream sauce. Fruits  Fruits canned with syrup. Meats and other protein foods Fatty cuts of meat. Poultry with skin. Breaded or fried meat. Processed meats. Dairy Full-fat yogurt, cheese, or milk. Beverages Sweetened drinks, such as sweet iced tea and soda. Fats and oils Butter. Lard. Ghee. Sweets and desserts Baked goods, such as cake, cupcakes, pastries, cookies, and cheesecake. Seasoning and other foods Spice mixes with added salt. Ketchup. Barbecue sauce. Mayonnaise. Summary  To prevent diabetes from developing, you may need to make diet and other lifestyle changes to help control blood sugar, improve cholesterol levels, and manage your blood pressure.  Set weight loss goals with the help of your health care team. It is recommended that most people with prediabetes lose 7 percent of their current body weight.  Consider following a Mediterranean diet that includes plenty of fresh fruits and vegetables, whole  grains, beans, nuts, seeds, fish, lean meat, low-fat dairy, and healthy oils. This information is not intended to replace advice given to you by your health care provider. Make sure you discuss any questions you have with your health care provider. Document Released: 06/14/2014 Document Revised: 05/22/2018 Document Reviewed: 04/03/2016 Elsevier Patient Education  2020 [employer].

## 2018-09-01 ENCOUNTER — Other Ambulatory Visit: Payer: Self-pay

## 2018-09-01 DIAGNOSIS — Z13220 Encounter for screening for lipoid disorders: Secondary | ICD-10-CM

## 2018-09-01 LAB — BASIC METABOLIC PANEL WITH GFR
BUN/Creatinine Ratio: 6 — ABNORMAL LOW (ref 10–24)
BUN: 9 mg/dL (ref 8–27)
CO2: 22 mmol/L (ref 20–29)
Calcium: 9.2 mg/dL (ref 8.6–10.2)
Chloride: 106 mmol/L (ref 96–106)
Creatinine, Ser: 1.47 mg/dL — ABNORMAL HIGH (ref 0.76–1.27)
GFR calc Af Amer: 57 mL/min/1.73 — ABNORMAL LOW
GFR calc non Af Amer: 50 mL/min/1.73 — ABNORMAL LOW
Glucose: 137 mg/dL — ABNORMAL HIGH (ref 65–99)
Potassium: 4.3 mmol/L (ref 3.5–5.2)
Sodium: 140 mmol/L (ref 134–144)

## 2018-09-01 LAB — CBC WITH DIFFERENTIAL/PLATELET
Basophils Absolute: 0.1 x10E3/uL (ref 0.0–0.2)
Basos: 2 %
EOS (ABSOLUTE): 0.2 x10E3/uL (ref 0.0–0.4)
Eos: 9 %
Hematocrit: 44.2 % (ref 37.5–51.0)
Hemoglobin: 14.8 g/dL (ref 13.0–17.7)
Immature Grans (Abs): 0 x10E3/uL (ref 0.0–0.1)
Immature Granulocytes: 0 %
Lymphocytes Absolute: 0.7 x10E3/uL (ref 0.7–3.1)
Lymphs: 25 %
MCH: 29.1 pg (ref 26.6–33.0)
MCHC: 33.5 g/dL (ref 31.5–35.7)
MCV: 87 fL (ref 79–97)
Monocytes Absolute: 0.3 x10E3/uL (ref 0.1–0.9)
Monocytes: 12 %
Neutrophils Absolute: 1.4 x10E3/uL (ref 1.4–7.0)
Neutrophils: 52 %
Platelets: 256 x10E3/uL (ref 150–450)
RBC: 5.09 x10E6/uL (ref 4.14–5.80)
RDW: 14.9 % (ref 11.6–15.4)
WBC: 2.7 x10E3/uL — ABNORMAL LOW (ref 3.4–10.8)

## 2018-09-01 LAB — HEMOGLOBIN A1C
Est. average glucose Bld gHb Est-mCnc: 131 mg/dL
Hgb A1c MFr Bld: 6.2 % — ABNORMAL HIGH (ref 4.8–5.6)

## 2018-09-02 LAB — TESTOSTERONE: Testosterone: 380 ng/dL (ref 264–916)

## 2018-09-02 LAB — SPECIMEN STATUS REPORT

## 2018-09-02 LAB — LIPID PANEL
Chol/HDL Ratio: 3.3 ratio (ref 0.0–5.0)
Cholesterol, Total: 169 mg/dL (ref 100–199)
HDL: 52 mg/dL
LDL Calculated: 104 mg/dL — ABNORMAL HIGH (ref 0–99)
Triglycerides: 65 mg/dL (ref 0–149)
VLDL Cholesterol Cal: 13 mg/dL (ref 5–40)

## 2018-09-05 ENCOUNTER — Other Ambulatory Visit: Payer: Self-pay | Admitting: Family Medicine

## 2018-09-05 DIAGNOSIS — D72819 Decreased white blood cell count, unspecified: Secondary | ICD-10-CM

## 2018-09-05 NOTE — Progress Notes (Signed)
Patient ID: Thomas Moody, male   DOB: 1955/05/22, 63 y.o.   MRN: [id number]   Patient with leukopenia on recent CBC with white blood cell count of 2.7 w.hich was done in follow-up of CBC done on 03/04/18 with wbc of 2.8. He will be referred to hematology for further evaluation.

## 2018-09-07 NOTE — Progress Notes (Signed)
Patient notified of results & recommendations. Expressed understanding.

## 2018-11-19 LAB — COLOGUARD: Cologuard: NEGATIVE

## 2019-01-19 ENCOUNTER — Telehealth: Payer: Self-pay

## 2019-01-19 NOTE — Telephone Encounter (Signed)
Cologuard results entered.

## 2019-03-12 ENCOUNTER — Ambulatory Visit
Admission: EM | Admit: 2019-03-12 | Discharge: 2019-03-12 | Disposition: A | Discharge status: Home / Self Care | Attending: Emergency Medicine | Admitting: Emergency Medicine

## 2019-03-12 ENCOUNTER — Ambulatory Visit (INDEPENDENT_AMBULATORY_CARE_PROVIDER_SITE_OTHER)

## 2019-03-12 DIAGNOSIS — M79601 Pain in right arm: Secondary | ICD-10-CM | POA: Diagnosis not present

## 2019-03-12 DIAGNOSIS — M25511 Pain in right shoulder: Secondary | ICD-10-CM | POA: Diagnosis not present

## 2019-03-12 DIAGNOSIS — W19XXXA Unspecified fall, initial encounter: Secondary | ICD-10-CM | POA: Diagnosis not present

## 2019-03-12 NOTE — ED Provider Notes (Signed)
[facility]    CSN: [id number] Arrival date & time: 03/12/19  1624      History   Chief Complaint Chief Complaint  Patient presents with  . Shoulder Injury    HPI Thomas Moody is a 64 y.o. male presented for right arm/shoulder pain after falling this morning around 8 AM.  Patient states that he hit his arm against the ground: Denies head trauma, LOC, difficulty breathing, chest pain, palpitations, abdominal pain.  Endorsing limited movement of arm (abduction) with associated decreased strength.  No paresthesias, neck pain, decreased range of motion of neck.  States he took aspirin without significant relief.  Has not yet iced area.   Past Medical History:  Diagnosis Date  . Asthma   . GERD (gastroesophageal reflux disease)     Patient Active Problem List   Diagnosis Date Noted  . Osteoarthritis of spine with radiculopathy, cervical region 03/22/2016    Past Surgical History:  Procedure Laterality Date  . PILONIDAL CYST EXCISION         Home Medications    Prior to Admission medications   Medication Sig Start Date End Date Taking? Authorizing Provider  aspirin 81 MG tablet Take 81 mg by mouth daily.    [provider]  CINNAMON PO Take 1 tablet by mouth daily.    [provider]    Family History Family History  Problem Relation Age of Onset  . Heart disease Mother   . Dementia Father   . Hypertension Sister   . Diabetes Son   . Diabetes type I Son   . Diabetes Paternal Grandmother   . Diabetes Son   . Diabetes type II Son   . Autism Son        middle son    Social History Social History   Tobacco Use  . Smoking status: Never Smoker  . Smokeless tobacco: Never Used  Substance Use Topics  . Alcohol use: Not Currently    Alcohol/week: 0.0 standard drinks    Comment: occ  . Drug use: No     Allergies   Patient has no known allergies.   Review of Systems As per HPI   Physical Exam Triage Vital  Signs ED Triage Vitals  Enc Vitals Group     BP      Pulse      Resp      Temp      Temp src      SpO2      Weight      Height      Head Circumference      Peak Flow      Pain Score      Pain Loc      Pain Edu?      Excl. in [place]?    No data found.  Updated Vital Signs BP (!) 145/77 (BP Location: Left Arm)   Pulse 91   Temp 98.7 F (37.1 C) (Oral)   Resp 18   SpO2 95%   Visual Acuity Right Eye Distance:   Left Eye Distance:   Bilateral Distance:    Right Eye Near:   Left Eye Near:    Bilateral Near:     Physical Exam Constitutional:      General: He is not in acute distress. HENT:     Head: Normocephalic and atraumatic.  Eyes:     General: No scleral icterus.    Pupils: Pupils are equal, round, and reactive to light.  Cardiovascular:     Rate and Rhythm: Normal rate.  Pulmonary:     Effort: Pulmonary effort is normal. No respiratory distress.     Breath sounds: No wheezing.  Musculoskeletal:     Comments: Right shoulder without deformity.  No bruising, erythema, edema.  No bony tenderness throughout shoulder joint or soft scapular spine.  Patient does have tenderness over lateral aspect of proximal humerus without crepitus.  Patient has decreased active ROM with abduction and decreased strength with shoulder flexion, otherwise unremarkable.  Neurovascularly intact.  Skin:    Coloration: Skin is not jaundiced or pale.  Neurological:     Mental Status: He is alert and oriented to person, place, and time.      UC Treatments / Results  Labs (all labs ordered are listed, but only abnormal results are displayed) Labs Reviewed - No data to display  EKG   Radiology DG Shoulder Right  Result Date: 03/12/2019 CLINICAL DATA:  Decreased range of motion after fall this morning. EXAM: RIGHT SHOULDER - 2+ VIEW COMPARISON:  None. FINDINGS: Mild glenohumeral degenerative changes are noted. No fractures are seen. Limited views of the right chest are normal. No  shoulder dislocation identified. AC joint degenerative changes noted. IMPRESSION: No fracture or dislocation. Glenohumeral and AC joint degenerative changes. Electronically Signed   By: Gerome Sam III M.D   On: 03/12/2019 17:22    Procedures Procedures (including critical care time)  Medications Ordered in UC Medications - No data to display  Initial Impression / Assessment and Plan / UC Course  I have reviewed the triage vital signs and the nursing notes.  Pertinent labs & imaging results that were available during my care of the patient were reviewed by me and considered in my medical decision making (see chart for details).     Right shoulder x-ray done office, reviewed by me radiology: Negative for fracture, dislocation.  Does have glenohumeral and AC joint degenerative changes.  Reviewed findings with patient who verbalized understanding.  Will practice RICE as outlined below.  Provided stretches, follow-up care with orthopedics as needed.  Return precautions discussed, patient verbalized understanding and is agreeable to plan. Final Clinical Impressions(s) / UC Diagnoses   Final diagnoses:  Musculoskeletal arm pain, right     Discharge Instructions     Recommend RICE: rest, ice, compression, elevation as needed for pain.    Heat therapy (hot compress, warm wash red, hot showers, etc.) can help relax muscles and soothe muscle aches. Cold therapy (ice packs) can be used to help swelling both after injury and after prolonged use of areas of chronic pain/aches.  For pain: recommend 350 mg-1000 mg of Tylenol (acetaminophen) and/or 200 mg - 800 mg of Advil (ibuprofen, Motrin) every 8 hours as needed.  May alternate between the two throughout the day as they are generally safe to take together.  DO NOT exceed more than 3000 mg of Tylenol or 3200 mg of ibuprofen in a 24 hour period as this could damage your stomach, kidneys, liver, or increase your bleeding risk.    ED  Prescriptions    None     PDMP not reviewed this encounter.   Odette Fraction [place], [place] 03/12/19 1754

## 2019-03-12 NOTE — ED Triage Notes (Signed)
Pt states slipped on ice this morning falling back hitting rt shoulder on curb. States limited movement, no deformity noted. States took ASA with some relief.

## 2019-03-12 NOTE — Discharge Instructions (Addendum)

## 2019-04-19 ENCOUNTER — Ambulatory Visit: Attending: Internal Medicine

## 2019-04-19 DIAGNOSIS — Z23 Encounter for immunization: Secondary | ICD-10-CM | POA: Insufficient documentation

## 2019-04-19 NOTE — Progress Notes (Signed)
   Covid-19 Vaccination Clinic  Name:  Thomas Moody    MRN: [id number] DOB: Jul 02, 1955  04/19/2019  [name] was observed post Covid-19 immunization for 15 minutes without incident. He was provided with Vaccine Information Sheet and instruction to access the V-Safe system.   [name] was instructed to call 911 with any severe reactions post vaccine: Marland Kitchen Difficulty breathing  . Swelling of face and throat  . A fast heartbeat  . A bad rash all over body  . Dizziness and weakness   Immunizations Administered    Name Date Dose VIS Date Route   Pfizer COVID-19 Vaccine 04/19/2019  9:42 AM 0.3 mL 01/22/2019 Intramuscular   Manufacturer: [employer], [employer]   Lot: [id number]   NDC: [address]-5

## 2019-05-19 ENCOUNTER — Ambulatory Visit: Attending: Internal Medicine

## 2019-05-19 DIAGNOSIS — Z23 Encounter for immunization: Secondary | ICD-10-CM

## 2019-05-19 NOTE — Progress Notes (Signed)
   Covid-19 Vaccination Clinic  Name:  Kinta Martis    MRN: [id number] DOB: 1955-08-04  05/19/2019  Mr. Grundman was observed post Covid-19 immunization for 15 minutes without incident. He was provided with Vaccine Information Sheet and instruction to access the V-Safe system.   Mr. Dorsch was instructed to call 911 with any severe reactions post vaccine: Marland Kitchen Difficulty breathing  . Swelling of face and throat  . A fast heartbeat  . A bad rash all over body  . Dizziness and weakness   Immunizations Administered    Name Date Dose VIS Date Route   Pfizer COVID-19 Vaccine 05/19/2019 10:56 AM 0.3 mL 01/22/2019 Intramuscular   Manufacturer: [employer], [employer]   Lot: [id number]   NDC: [address]-5

## 2019-10-04 ENCOUNTER — Ambulatory Visit
Admission: EM | Admit: 2019-10-04 | Discharge: 2019-10-04 | Disposition: A | Discharge status: Home / Self Care | Payer: TRICARE For Life (TFL) | Attending: Family Medicine | Admitting: Family Medicine

## 2019-10-04 ENCOUNTER — Other Ambulatory Visit: Payer: Self-pay

## 2019-10-04 ENCOUNTER — Encounter: Payer: Self-pay | Admitting: Emergency Medicine

## 2019-10-04 DIAGNOSIS — R42 Dizziness and giddiness: Secondary | ICD-10-CM | POA: Diagnosis not present

## 2019-10-04 MED ORDER — MECLIZINE HCL 12.5 MG PO TABS: 12.5000 mg | ORAL_TABLET | Freq: Three times a day (TID) | ORAL | 0 refills | Status: DC | PRN

## 2019-10-04 MED ORDER — ONDANSETRON 4 MG PO TBDP: 4.0000 mg | ORAL_TABLET | Freq: Three times a day (TID) | ORAL | 0 refills | Status: DC | PRN

## 2019-10-04 NOTE — ED Provider Notes (Signed)
[facility]    CSN: [id number] Arrival date & time: 10/04/19  1134      History   Chief Complaint Chief Complaint  Patient presents with  . Dizziness    HPI Thomas Moody is a 64 y.o. male.   Patient is a 64 year old male with possible history of asthma and GERD.  He presents today for complaints of dizziness, sensation of the room spinning with nausea upon waking this a.m.  Symptoms been constant.  Denies any associated blurred vision, headache, slurred speech, facial droop, extremity weakness, numbness, tingling.  Denies any history of vertigo.  Denies any ear pain, congestion     Past Medical History:  Diagnosis Date  . Asthma   . GERD (gastroesophageal reflux disease)     Patient Active Problem List   Diagnosis Date Noted  . Osteoarthritis of spine with radiculopathy, cervical region 03/22/2016    Past Surgical History:  Procedure Laterality Date  . PILONIDAL CYST EXCISION         Home Medications    Prior to Admission medications   Medication Sig Start Date End Date Taking? Authorizing Provider  aspirin 81 MG tablet Take 81 mg by mouth daily.    [provider]  CINNAMON PO Take 1 tablet by mouth daily.    [provider]  meclizine (ANTIVERT) 12.5 MG tablet Take 1 tablet (12.5 mg total) by mouth 3 (three) times daily as needed for dizziness. 10/04/19   Dahlia Byes A, NP  ondansetron (ZOFRAN ODT) 4 MG disintegrating tablet Take 1 tablet (4 mg total) by mouth every 8 (eight) hours as needed for nausea or vomiting. 10/04/19   Janace Aris, NP    Family History Family History  Problem Relation Age of Onset  . Heart disease Mother   . Dementia Father   . Hypertension Sister   . Diabetes Son   . Diabetes type I Son   . Diabetes Paternal Grandmother   . Diabetes Son   . Diabetes type II Son   . Autism Son        middle son    Social History Social History   Tobacco Use  . Smoking status: Never Smoker  . Smokeless  tobacco: Never Used  Substance Use Topics  . Alcohol use: Not Currently    Alcohol/week: 0.0 standard drinks    Comment: occ  . Drug use: No     Allergies   Patient has no known allergies.   Review of Systems Review of Systems   Physical Exam Triage Vital Signs ED Triage Vitals  Enc Vitals Group     BP 10/04/19 1144 137/80     Pulse Rate 10/04/19 1144 68     Resp 10/04/19 1144 18     Temp 10/04/19 1144 (!) 97.5 F (36.4 C)     Temp Source 10/04/19 1144 Oral     SpO2 10/04/19 1144 96 %     Weight --      Height --      Head Circumference --      Peak Flow --      Pain Score 10/04/19 1145 0     Pain Loc --      Pain Edu? --      Excl. in GC? --    No data found.  Updated Vital Signs BP 137/80 (BP Location: Right Arm)   Pulse 68   Temp (!) 97.5 F (36.4 C) (Oral)   Resp 18  SpO2 96%   Visual Acuity Right Eye Distance:   Left Eye Distance:   Bilateral Distance:    Right Eye Near:   Left Eye Near:    Bilateral Near:     Physical Exam Vitals and nursing note reviewed.  Constitutional:      General: He is not in acute distress.    Appearance: Normal appearance. He is not ill-appearing, toxic-appearing or diaphoretic.  HENT:     Head: Normocephalic and atraumatic.     Right Ear: Tympanic membrane and ear canal normal.     Left Ear: Tympanic membrane and ear canal normal.     Nose: Nose normal.  Eyes:     Extraocular Movements: Extraocular movements intact.     Conjunctiva/sclera: Conjunctivae normal.     Pupils: Pupils are equal, round, and reactive to light.  Cardiovascular:     Rate and Rhythm: Normal rate and regular rhythm.  Pulmonary:     Effort: Pulmonary effort is normal.     Breath sounds: Normal breath sounds.  Musculoskeletal:        General: Normal range of motion.     Cervical back: Normal range of motion.  Skin:    General: Skin is warm and dry.  Neurological:     General: No focal deficit present.     Mental Status: He is alert.      Cranial Nerves: No cranial nerve deficit.     Comments: Cranial nerves grossly intact.  No slurred speech.  No facial droop. Mild swaying with Romberg Grip strength equal   Psychiatric:        Mood and Affect: Mood normal.      UC Treatments / Results  Labs (all labs ordered are listed, but only abnormal results are displayed) Labs Reviewed  NOVEL CORONAVIRUS, NAA    EKG   Radiology No results found.  Procedures Procedures (including critical care time)  Medications Ordered in UC Medications - No data to display  Initial Impression / Assessment and Plan / UC Course  I have reviewed the triage vital signs and the nursing notes.  Pertinent labs & imaging results that were available during my care of the patient were reviewed by me and considered in my medical decision making (see chart for details).     Dizziness Symptoms slightly related to vertigo. No concerns on neurological exam at this time. Vital signs stable. Will rule out Covid with Covid swab pending. Treating with meclizine and Zofran as needed for nausea. Recommended rest, hydrate and go to the ER for worsening symptoms Final Clinical Impressions(s) / UC Diagnoses   Final diagnoses:  Dizziness     Discharge Instructions     Meclizine as needed for dizziness and Zofran for nausea. Rest, hydrate. Covid swab pending Follow up as needed for continued or worsening symptoms Go to the ER for worsening symptoms      ED Prescriptions    Medication Sig Dispense Auth. Provider   ondansetron (ZOFRAN ODT) 4 MG disintegrating tablet Take 1 tablet (4 mg total) by mouth every 8 (eight) hours as needed for nausea or vomiting. 20 tablet Latonyia Lopata A, NP   meclizine (ANTIVERT) 12.5 MG tablet Take 1 tablet (12.5 mg total) by mouth 3 (three) times daily as needed for dizziness. 30 tablet Dahlia Byes A, NP     PDMP not reviewed this encounter.   Dahlia Byes A, NP 10/05/19 1021

## 2019-10-04 NOTE — Discharge Instructions (Addendum)
Meclizine as needed for dizziness and Zofran for nausea. Rest, hydrate. Covid swab pending Follow up as needed for continued or worsening symptoms Go to the ER for worsening symptoms

## 2019-10-04 NOTE — ED Triage Notes (Signed)
Pt sts dizziness with nausea and feeling like the room is spinning upon waking this am

## 2019-10-06 LAB — SARS-COV-2, NAA 2 DAY TAT

## 2019-10-06 LAB — NOVEL CORONAVIRUS, NAA: SARS-CoV-2, NAA: NOT DETECTED

## 2020-05-10 ENCOUNTER — Other Ambulatory Visit: Payer: Self-pay

## 2020-05-10 ENCOUNTER — Ambulatory Visit: Payer: TRICARE For Life (TFL) | Admitting: Family

## 2020-05-10 ENCOUNTER — Ambulatory Visit (INDEPENDENT_AMBULATORY_CARE_PROVIDER_SITE_OTHER): Payer: Medicare Other | Admitting: Family

## 2020-05-10 ENCOUNTER — Encounter: Payer: Self-pay | Admitting: Family

## 2020-05-10 VITALS — BP 127/77 | HR 89 | Ht 71.77 in | Wt 240.8 lb

## 2020-05-10 DIAGNOSIS — M5441 Lumbago with sciatica, right side: Secondary | ICD-10-CM

## 2020-05-10 DIAGNOSIS — M5442 Lumbago with sciatica, left side: Secondary | ICD-10-CM | POA: Diagnosis not present

## 2020-05-10 DIAGNOSIS — G8929 Other chronic pain: Secondary | ICD-10-CM

## 2020-05-10 DIAGNOSIS — Z7689 Persons encountering health services in other specified circumstances: Secondary | ICD-10-CM

## 2020-05-10 MED ORDER — MELOXICAM 7.5 MG PO TABS: 7.5000 mg | ORAL_TABLET | Freq: Every day | ORAL | 1 refills | Status: DC

## 2020-05-10 NOTE — Progress Notes (Signed)
Subjective:    Thomas Moody - 65 y.o. male MRN [id number]  Date of birth: Jan 21, 1956  HPI  Michaelpaul Apo is to establish care. Patient has a PMH significant for osteoarthritis of spine with radiculopathy cervical region.   Current issues and/or concerns: 1. BACK PAIN: Location: lower midline  Onset: ongoing for years but worsening the past 5 months  Description: 6/10 Radiation: bilateral hips   Symptoms Worse with: sudden movement, heavy lifting Better with: nothing, taking Tylenol and Aspirin without relief Trauma:  Recently trying to help father with Alzheimer's off the floor after a fall and then felt like something popped. Patient was also in the [employer] where heavy lifting and loading was required. After the [employer] later went on to serve in the [employer] where the same heavy lifting patients and obstacle courses were required. Feels years of heavy lifting has deconditioned his back.  Popping/clicking sounds with movement/bending: yes  Muscle spasms: yes  Comments: Reports in the past he was seeing a specialist. Was told issues seemed to be related to  deterioration of the bones. Recommended exercise, diet, and weight management. No surgery.  Comments:  ROS per HPI    Health Maintenance:  Health Maintenance Due  Topic Date Due  . PNA vac Low Risk Adult (1 of 2 - PCV13) 08/14/2019  . INFLUENZA VACCINE  09/12/2019  . COVID-19 Vaccine (3 - Booster for [employer] series) 11/18/2019    Past Medical History: Patient Active Problem List   Diagnosis Date Noted  . Osteoarthritis of spine with radiculopathy, cervical region 03/22/2016    Social History   reports that he has never smoked. He has never used smokeless tobacco. He reports previous alcohol use. He reports that he does not use drugs.   Family History  family history includes Autism in his son; Dementia in his father; Diabetes in his paternal grandmother, son, and son; Diabetes type I in his son; Diabetes type II  in his son; Heart disease in his mother; Hypertension in his sister.   Medications: reviewed and updated   Objective:   Physical Exam BP 127/77 (BP Location: Left Arm, Patient Position: Sitting)   Pulse 89   Ht 5' 11.77" (1.823 m)   Wt 240 lb 12.8 oz (109.2 kg)   SpO2 96%   BMI 32.87 kg/m  Physical Exam HENT:     Head: Normocephalic and atraumatic.  Eyes:     Extraocular Movements: Extraocular movements intact.     Conjunctiva/sclera: Conjunctivae normal.     Pupils: Pupils are equal, round, and reactive to light.  Cardiovascular:     Rate and Rhythm: Normal rate and regular rhythm.     Pulses: Normal pulses.     Heart sounds: Normal heart sounds.  Pulmonary:     Effort: Pulmonary effort is normal.     Breath sounds: Normal breath sounds.  Musculoskeletal:     Cervical back: Normal range of motion and neck supple.  Neurological:     General: No focal deficit present.     Mental Status: He is alert and oriented to person, place, and time.  Psychiatric:        Mood and Affect: Mood normal.        Behavior: Behavior normal.         Assessment & Plan:  1. Encounter to establish care: - Patient presents today to establish care.  - Return for annual physical examination, labs, and health maintenance. Arrive fasting meaning having had no food  and/or nothing to drink for at least 8 hours prior to appointment.  Please take scheduled medications as normal.  2. Chronic midline low back pain with bilateral sciatica: - Chronic and worsening. - Meloxicam as prescribed. Do not consume with NSAID's such as but not limited to Ibuprofen, Aleve, and Naproxen. May alternate with over-the-counter Tylenol. Patient verbalized understanding. - Referral to Orthopedic Surgery for further evaluation and management.  - Follow-up with primary provider as scheduled.  - meloxicam (MOBIC) 7.5 MG tablet; Take 1 tablet (7.5 mg total) by mouth daily.  Dispense: 30 tablet; Refill: 1 - Ambulatory  referral to Orthopedic Surgery   Patient was given clear instructions to go to Emergency Department or return to medical center if symptoms don't improve, worsen, or new problems develop.The patient verbalized understanding.  I discussed the assessment and treatment plan with the patient. The patient was provided an opportunity to ask questions and all were answered. The patient agreed with the plan and demonstrated an understanding of the instructions.   The patient was advised to call back or seek an in-person evaluation if the symptoms worsen or if the condition fails to improve as anticipated.    Ricky Stabs, NP 05/11/2020, 9:27 PM Primary Care at [facility]

## 2020-05-10 NOTE — Patient Instructions (Addendum)
- Return for annual physical examination, labs, and health maintenance. Arrive fasting meaning having had no food and/or nothing to drink for at least 8 hours prior to appointment.  Please take scheduled medications as normal. - Meloxicam for back pain.  - Referral to Orthopedics for back pain. Thank you for choosing Primary Care at [facility] for your medical home!    Thomas Moody was seen by Rema Fendt, NP today.   Linden Dolin Buerger's primary care provider is Rema Fendt, NP.   For the best care possible,  you should try to see Ricky Stabs, NP whenever you come to clinic.   We look forward to seeing you again soon!  If you have any questions about your visit today,  please call us at [phone]  Or feel free to reach your provider via MyChart.    DASH Eating Plan DASH stands for Dietary Approaches to Stop Hypertension. The DASH eating plan is a healthy eating plan that has been shown to:  Reduce high blood pressure (hypertension).  Reduce your risk for type 2 diabetes, heart disease, and stroke.  Help with weight loss. What are tips for following this plan? Reading food labels  Check food labels for the amount of salt (sodium) per serving. Choose foods with less than 5 percent of the Daily Value of sodium. Generally, foods with less than 300 milligrams (mg) of sodium per serving fit into this eating plan.  To find whole grains, look for the word "whole" as the first word in the ingredient list. Shopping  Buy products labeled as "low-sodium" or "no salt added."  Buy fresh foods. Avoid canned foods and pre-made or frozen meals. Cooking  Avoid adding salt when cooking. Use salt-free seasonings or herbs instead of table salt or sea salt. Check with your health care provider or pharmacist before using salt substitutes.  Do not fry foods. Cook foods using healthy methods such as baking, boiling, grilling, roasting, and broiling instead.  Cook with  heart-healthy oils, such as olive, canola, avocado, soybean, or sunflower oil. Meal planning  Eat a balanced diet that includes: ? 4 or more servings of fruits and 4 or more servings of vegetables each day. Try to fill one-half of your plate with fruits and vegetables. ? 6-8 servings of whole grains each day. ? Less than 6 oz (170 g) of lean meat, poultry, or fish each day. A 3-oz (85-g) serving of meat is about the same size as a deck of cards. One egg equals 1 oz (28 g). ? 2-3 servings of low-fat dairy each day. One serving is 1 cup (237 mL). ? 1 serving of nuts, seeds, or beans 5 times each week. ? 2-3 servings of heart-healthy fats. Healthy fats called omega-3 fatty acids are found in foods such as walnuts, flaxseeds, fortified milks, and eggs. These fats are also found in cold-water fish, such as sardines, salmon, and mackerel.  Limit how much you eat of: ? Canned or prepackaged foods. ? Food that is high in trans fat, such as some fried foods. ? Food that is high in saturated fat, such as fatty meat. ? Desserts and other sweets, sugary drinks, and other foods with added sugar. ? Full-fat dairy products.  Do not salt foods before eating.  Do not eat more than 4 egg yolks a week.  Try to eat at least 2 vegetarian meals a week.  Eat more home-cooked food and less restaurant, buffet, and fast food.   Lifestyle  When eating at a restaurant, ask that your food be prepared with less salt or no salt, if possible.  If you drink alcohol: ? Limit how much you use to:  0-1 drink a day for women who are not pregnant.  0-2 drinks a day for men. ? Be aware of how much alcohol is in your drink. In the [place] one drink equals one 12 oz bottle of beer (355 mL), one 5 oz glass of wine (148 mL), or one 1 oz glass of hard liquor (44 mL). General information  Avoid eating more than 2,300 mg of salt a day. If you have hypertension, you may need to reduce your sodium intake to 1,500 mg a  day.  Work with your health care provider to maintain a healthy body weight or to lose weight. Ask what an ideal weight is for you.  Get at least 30 minutes of exercise that causes your heart to beat faster (aerobic exercise) most days of the week. Activities may include walking, swimming, or biking.  Work with your health care provider or dietitian to adjust your eating plan to your individual calorie needs. What foods should I eat? Fruits All fresh, dried, or frozen fruit. Canned fruit in natural juice (without added sugar). Vegetables Fresh or frozen vegetables (raw, steamed, roasted, or grilled). Low-sodium or reduced-sodium tomato and vegetable juice. Low-sodium or reduced-sodium tomato sauce and tomato paste. Low-sodium or reduced-sodium canned vegetables. Grains Whole-grain or whole-wheat bread. Whole-grain or whole-wheat pasta. Brown rice. Orpah Cobb. Bulgur. Whole-grain and low-sodium cereals. Pita bread. Low-fat, low-sodium crackers. Whole-wheat flour tortillas. Meats and other proteins Skinless chicken or [place]. Ground chicken or [place]. Pork with fat trimmed off. Fish and seafood. Egg whites. Dried beans, peas, or lentils. Unsalted nuts, nut butters, and seeds. Unsalted canned beans. Lean cuts of beef with fat trimmed off. Low-sodium, lean precooked or cured meat, such as sausages or meat loaves. Dairy Low-fat (1%) or fat-free (skim) milk. Reduced-fat, low-fat, or fat-free cheeses. Nonfat, low-sodium ricotta or cottage cheese. Low-fat or nonfat yogurt. Low-fat, low-sodium cheese. Fats and oils Soft margarine without trans fats. Vegetable oil. Reduced-fat, low-fat, or light mayonnaise and salad dressings (reduced-sodium). Canola, safflower, olive, avocado, soybean, and sunflower oils. Avocado. Seasonings and condiments Herbs. Spices. Seasoning mixes without salt. Other foods Unsalted popcorn and pretzels. Fat-free sweets. The items listed above may not be a complete list of  foods and beverages you can eat. Contact a dietitian for more information. What foods should I avoid? Fruits Canned fruit in a light or heavy syrup. Fried fruit. Fruit in cream or butter sauce. Vegetables Creamed or fried vegetables. Vegetables in a cheese sauce. Regular canned vegetables (not low-sodium or reduced-sodium). Regular canned tomato sauce and paste (not low-sodium or reduced-sodium). Regular tomato and vegetable juice (not low-sodium or reduced-sodium). Rosita Fire. Olives. Grains Baked goods made with fat, such as croissants, muffins, or some breads. Dry pasta or rice meal packs. Meats and other proteins Fatty cuts of meat. Ribs. Fried meat. Tomasa Blase. Bologna, salami, and other precooked or cured meats, such as sausages or meat loaves. Fat from the back of a pig (fatback). Bratwurst. Salted nuts and seeds. Canned beans with added salt. Canned or smoked fish. Whole eggs or egg yolks. Chicken or [place] with skin. Dairy Whole or 2% milk, cream, and half-and-half. Whole or full-fat cream cheese. Whole-fat or sweetened yogurt. Full-fat cheese. Nondairy creamers. Whipped toppings. Processed cheese and cheese spreads. Fats and oils Butter. Stick margarine. Lard. Shortening. Ghee. Bacon fat. Tropical oils,  such as coconut, palm kernel, or palm oil. Seasonings and condiments Onion salt, garlic salt, seasoned salt, table salt, and sea salt. Worcestershire sauce. Tartar sauce. Barbecue sauce. Teriyaki sauce. Soy sauce, including reduced-sodium. Steak sauce. Canned and packaged gravies. Fish sauce. Oyster sauce. Cocktail sauce. Store-bought horseradish. Ketchup. Mustard. Meat flavorings and tenderizers. Bouillon cubes. Hot sauces. Pre-made or packaged marinades. Pre-made or packaged taco seasonings. Relishes. Regular salad dressings. Other foods Salted popcorn and pretzels. The items listed above may not be a complete list of foods and beverages you should avoid. Contact a dietitian for more  information. Where to find more information  National Heart, Lung, and Blood Institute: PopSteam.is  American Heart Association: www.heart.org  Academy of Nutrition and Dietetics: www.eatright.org  National Kidney Foundation: www.kidney.org Summary  The DASH eating plan is a healthy eating plan that has been shown to reduce high blood pressure (hypertension). It may also reduce your risk for type 2 diabetes, heart disease, and stroke.  When on the DASH eating plan, aim to eat more fresh fruits and vegetables, whole grains, lean proteins, low-fat dairy, and heart-healthy fats.  With the DASH eating plan, you should limit salt (sodium) intake to 2,300 mg a day. If you have hypertension, you may need to reduce your sodium intake to 1,500 mg a day.  Work with your health care provider or dietitian to adjust your eating plan to your individual calorie needs. This information is not intended to replace advice given to you by your health care provider. Make sure you discuss any questions you have with your health care provider. Document Revised: 01/01/2019 Document Reviewed: 01/01/2019 Elsevier Patient Education  2021 [employer].

## 2020-05-10 NOTE — Progress Notes (Signed)
Establish care Pt experiencing lower back pain 4 months Aspirin and Tylenol taken to alleviate pain no relief

## 2020-05-15 ENCOUNTER — Other Ambulatory Visit: Payer: Self-pay

## 2020-05-15 ENCOUNTER — Ambulatory Visit (INDEPENDENT_AMBULATORY_CARE_PROVIDER_SITE_OTHER): Payer: Medicare Other

## 2020-05-15 ENCOUNTER — Ambulatory Visit (INDEPENDENT_AMBULATORY_CARE_PROVIDER_SITE_OTHER): Payer: Medicare Other | Admitting: Family Medicine

## 2020-05-15 ENCOUNTER — Encounter: Payer: Self-pay | Admitting: Family Medicine

## 2020-05-15 DIAGNOSIS — G8929 Other chronic pain: Secondary | ICD-10-CM | POA: Diagnosis not present

## 2020-05-15 DIAGNOSIS — M545 Low back pain, unspecified: Secondary | ICD-10-CM | POA: Diagnosis not present

## 2020-05-15 NOTE — Progress Notes (Signed)
Office Visit Note   Patient: Thomas Moody           Date of Birth: 05-Sep-1955           MRN: [id number] Visit Date: 05/15/2020 Requested by: Rema Fendt, NP [address] [place],  [place] [address] PCP: Rema Fendt, NP  Subjective: Chief Complaint  Patient presents with  . Lower Back - Pain    Pain in the center of the lower back, extending into the posterior to lateral hips at times. Pain started 5-6 months ago. Felt a pop in his back with trying to move/lift his father from a sitting position on the bed to standing. The pain is some better with meloxicam and with changing shoes.    HPI: He is here with low back pain.  Symptoms started 5 or 6 months ago, no definite injury but he has been helping his father who has Alzheimer's, doing some lifting.  Over the years he has had some injuries to his spine from strenuous lifting.  About 4 or 5 years ago he went to physical therapy and his back pain improved and he was doing home exercises until Covid hit, then he stopped doing them.  Since his back started hurting him 5 or 6 months ago he has been doing some exercises but this time it does not seem to be eliminating his pain.  Meloxicam does give him some relief.  Pain is worse when standing and walking, better when sitting unless his chair is too firm.  No bowel or bladder dysfunction related to his back.  No fevers or chills.               ROS:   All other systems were reviewed and are negative.  Objective: Vital Signs: There were no vitals taken for this visit.  Physical Exam:  General:  Alert and oriented, in no acute distress. Pulm:  Breathing unlabored. Psy:  Normal mood, congruent affect.  Low back: He has tenderness in the midline over the L5-S1 level.  No pain over the SI joints or in the sciatic notch.  No pain with internal hip rotation and he has good range of motion of both hips.  Lower extremity strength and reflexes are normal.  Imaging: XR Lumbar Spine 2-3  Views  Result Date: 05/15/2020 X-rays lumbar spine reveal moderate L5-S1 degenerative disc disease and mild at the other lumbar levels.  Well-preserved hip joint spaces but he does have some early periarticular spurring.  No sign of neoplasm.   Assessment & Plan: 1.  Lumbar spondylosis -We will try physical therapy again.  He did not want any different medications.  If he fails to improve could contemplate MRI scan.     Procedures: No procedures performed        PMFS History: Patient Active Problem List   Diagnosis Date Noted  . Osteoarthritis of spine with radiculopathy, cervical region 03/22/2016   Past Medical History:  Diagnosis Date  . Asthma   . GERD (gastroesophageal reflux disease)     Family History  Problem Relation Age of Onset  . Heart disease Mother   . Dementia Father   . Hypertension Sister   . Diabetes Son   . Diabetes type I Son   . Diabetes Paternal Grandmother   . Diabetes Son   . Diabetes type II Son   . Autism Son        middle son    Past Surgical History:  Procedure Laterality Date  . PILONIDAL CYST EXCISION     Social History   Occupational History  . Not on file  Tobacco Use  . Smoking status: Never Smoker  . Smokeless tobacco: Never Used  Vaping Use  . Vaping Use: Never used  Substance and Sexual Activity  . Alcohol use: Not Currently    Alcohol/week: 0.0 standard drinks    Comment: occ  . Drug use: No  . Sexual activity: Yes

## 2020-05-30 NOTE — Progress Notes (Signed)
Patient did not show for appointment.   

## 2020-05-31 ENCOUNTER — Encounter: Payer: TRICARE For Life (TFL) | Admitting: Family

## 2020-05-31 DIAGNOSIS — Z1329 Encounter for screening for other suspected endocrine disorder: Secondary | ICD-10-CM

## 2020-05-31 DIAGNOSIS — Z13220 Encounter for screening for lipoid disorders: Secondary | ICD-10-CM

## 2020-05-31 DIAGNOSIS — Z13228 Encounter for screening for other metabolic disorders: Secondary | ICD-10-CM

## 2020-05-31 DIAGNOSIS — Z131 Encounter for screening for diabetes mellitus: Secondary | ICD-10-CM

## 2020-05-31 DIAGNOSIS — Z130 Encounter for screening for diseases of the blood and blood-forming organs and certain disorders involving the immune mechanism: Secondary | ICD-10-CM

## 2020-05-31 DIAGNOSIS — Z0000 Encounter for general adult medical examination without abnormal findings: Secondary | ICD-10-CM

## 2020-06-06 ENCOUNTER — Encounter: Payer: Self-pay | Admitting: Physical Therapy

## 2020-06-06 ENCOUNTER — Ambulatory Visit: Payer: Medicare Other | Attending: Family Medicine | Admitting: Physical Therapy

## 2020-06-06 ENCOUNTER — Other Ambulatory Visit: Payer: Self-pay

## 2020-06-06 DIAGNOSIS — M5442 Lumbago with sciatica, left side: Secondary | ICD-10-CM | POA: Diagnosis present

## 2020-06-06 DIAGNOSIS — R252 Cramp and spasm: Secondary | ICD-10-CM | POA: Diagnosis present

## 2020-06-06 DIAGNOSIS — G8929 Other chronic pain: Secondary | ICD-10-CM | POA: Insufficient documentation

## 2020-06-06 DIAGNOSIS — M5441 Lumbago with sciatica, right side: Secondary | ICD-10-CM | POA: Diagnosis present

## 2020-06-06 DIAGNOSIS — M6281 Muscle weakness (generalized): Secondary | ICD-10-CM | POA: Insufficient documentation

## 2020-06-06 NOTE — Therapy (Signed)
[facility] [address] [place], [place], [address] Phone: [phone]   Fax:  [phone]  Physical Therapy Evaluation  Patient Details  Name: Thomas Moody MRN: [id number] Date of Birth: [birth year] Referring Provider (PT): Hilts   Encounter Date: 06/06/2020   PT End of Session - 06/06/20 1051    Visit Number 1    Date for PT Re-Evaluation 08/29/20    PT Start Time 1007    PT Stop Time 1043    PT Time Calculation (min) 36 min    Activity Tolerance Patient tolerated treatment well    Behavior During Therapy [facility] for tasks assessed/performed           Past Medical History:  Diagnosis Date  . Asthma   . GERD (gastroesophageal reflux disease)     Past Surgical History:  Procedure Laterality Date  . PILONIDAL CYST EXCISION      There were no vitals filed for this visit.    Subjective Assessment - 06/06/20 1006    Subjective Pt reports that he has had chronic LBP for years but worsening over the past ~year. Pt states that he had to do a lot of lifting in the [employer] and in other jobs throughout the years which he thinks has contributing to his back pain. 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 Since then he has had intense pain in the center of his low back. Pt reports radiating pain into hips and posterior LEs. Pt reports that radiating pain has gotten a little bit better with Meloxicam. He has been guarding his back and moving carefully but still experiencing pain. Is having increased pain with daily tasks like lifting things around the house or mowing the lawn. Denies N/T in B feet.    Pertinent History asthma    Limitations Standing;Walking    Diagnostic tests xrays lumbar spine    Patient Stated Goals get rid of LBP, learn ex's to get moving again/reduce weight, increase ROM    Currently in Pain? Yes    Pain Score 3      Pain Location Back    Pain Orientation Lower    Pain Descriptors / Indicators Dull;Sharp    Pain Type Chronic pain    Pain Radiating Towards posterior BLEs    Aggravating Factors  lifting heavy objects, repetitive bending over, dull ache with prolonged standing    Pain Relieving Factors meloxicam, unable to identify other easing factors              [facility] PT Assessment - 06/06/20 0001      Assessment   Medical Diagnosis LBP    Referring Provider (PT) Hilts    Next MD Visit --   will call back as needed   Prior Therapy PT for LB; states this was helpful in the past      Precautions   Precautions None      Restrictions   Weight Bearing Restrictions No      Balance Screen   Has the patient fallen in the past 6 months No    Has the patient had a decrease in activity level because of a fear of falling?  No    Is the patient reluctant to leave their home because of a fear of falling?  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    Leisure sedentary; would like to get back to walking/swimming/working out      Observation/Other Assessments   Focus on Therapeutic Outcomes (FOTO)  41%      Sensation   Light Touch Appears Intact      Functional Tests   Functional tests Sit to Stand      Sit to Stand   Comments difficulty with eccentric control; pushing with UEs with fatigue      Posture/Postural Control   Posture/Postural Control Postural limitations    Postural Limitations Rounded Shoulders;Forward head;Decreased lumbar lordosis      ROM / Strength   AROM / PROM / Strength AROM;Strength      AROM   AROM Assessment Site Lumbar    Lumbar Flexion 25% limited    Lumbar Extension 25% limited     Lumbar - Right Side Bend 25% limited    Lumbar - Left Side Bend 25% limited    Lumbar - Right Rotation WFL    Lumbar - Left Rotation WFL      Strength   Overall Strength Comments BLE 5/5      Flexibility   Soft Tissue Assessment /Muscle Length yes    Hamstrings very tight B + pain    ITB tight B    Piriformis tight B      Palpation   Spinal mobility hypomobility lumbar spine    Palpation comment mild tenderness L5-S1; palpable tightness B lumbar paraspinals      Special Tests   Other special tests SLR 60 deg B      Transfers   Five time sit to stand comments  <20 sec; pushing on thighs with UEs after 3 reps; quick to fatigue                      Objective measurements completed on examination: See above findings.       [facility] Adult PT Treatment/Exercise - 06/06/20 0001      Exercises   Exercises Lumbar      Lumbar Exercises: Stretches   Active Hamstring Stretch Right;Left;1 rep;20 seconds    Lower Trunk Rotation 5 reps    Other Lumbar Stretch Exercise fwd/lat seated lumbar flexion      Lumbar Exercises: Supine   Bridge 5 reps;3 seconds                  PT Education - 06/06/20 1051    Education Details Pt educated on POC and HEP    Person(s) Educated Patient    Methods Explanation;Demonstration;Handout    Comprehension Verbalized understanding;Returned demonstration            PT Short Term Goals - 06/06/20 1055      PT SHORT TERM GOAL #1   Title Pt will be I with initial HEP    Time 2    Period Weeks    Status New    Target Date 06/20/20             PT Long Term Goals - 06/06/20 1055      PT LONG TERM GOAL #1   Title Pt will be I  with advanced HEP    Time 8    Period Weeks    Status New    Target Date 08/01/20      PT LONG TERM GOAL #2   Title Pt will report 50% reduction in LBP    Time 8    Period Weeks    Status New    Target Date 08/01/20      PT LONG TERM GOAL #3   Title Pt will report resolution of BLE  radiating pain    Time 8    Period Weeks    Status New    Target Date 08/01/20      PT LONG TERM GOAL #4   Title Pt will report able to perform ADLs such as mowing lawn and lifting objects around house with no increase in LBP    Time 8    Period Weeks    Status New    Target Date 08/01/20      PT LONG TERM GOAL #5   Title Pt will demo BLE SLR to 85 deg    Baseline 60 deg BLE    Time 8    Period Weeks    Status New    Target Date 08/01/20                  Plan - 06/06/20 1051    Clinical Impression Statement Pt presents to clinic with reports of chronic LBP worsening over the past 6 months. 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 Pt has had xrays to lumbar spine which show moderate degnerative changes. Reports BLE radiating pain posteriorly, denies N/T. BLE sensation intact. Pt demos mild limitations in LB AROM, palpable tightness lumbar paraspinals, signficant deficits in HS flexibility/general LE flexibility, and functional weakness with 5x STS. Pt would benefit from LE/lumbar flexibility, core stab, functional strengthening, and manual to lumbar to address the above deficits.    Personal Factors and Comorbidities Comorbidity 1    Comorbidities asthma    Examination-Activity Limitations Lift;Bend    Examination-Participation Restrictions Community Activity;Interpersonal Relationship    Stability/Clinical Decision Making Stable/Uncomplicated    Clinical Decision Making Low    Rehab Potential Good    PT Frequency 2x / week    PT Duration 8 weeks    PT Treatment/Interventions ADLs/Self Care Home Management;Electrical Stimulation;Iontophoresis 4mg /ml Dexamethasone;Moist Heat;Traction;Neuromuscular re-education;Therapeutic exercise;Therapeutic activities;Stair training;Patient/family education;Manual techniques;Dry needling;Passive range of motion    PT Next Visit Plan  LE/lumbar flexibility, core stab, functional strengthening, and manual to lumbar    PT Home Exercise Plan see pt instructions    Consulted and Agree with Plan of Care Patient           Patient will benefit from skilled therapeutic intervention in order to improve the following deficits and impairments:  Abnormal gait,Decreased range of motion,Difficulty walking,Decreased endurance,Increased muscle spasms,Decreased activity tolerance,Pain,Hypomobility,Impaired flexibility,Decreased strength  Visit Diagnosis: Chronic bilateral low back pain with bilateral sciatica  Muscle weakness (generalized)  Cramp and spasm     Problem List Patient Active Problem List   Diagnosis Date Noted  . Osteoarthritis of spine with radiculopathy, cervical region 03/22/2016   05/20/2016, PT, DPT Lysle Rubens Luvenia Cranford 06/06/2020, 10:57 AM  [facility] [address] [place], [place], [place] Phone: [phone]   Fax:  [phone]  Name: Kayd Launer MRN: Meyer Cory Date of Birth:  10/04/1955  

## 2020-06-06 NOTE — Patient Instructions (Signed)
Access Code: [email or web address] URL: [email or web address] Date: 06/06/2020 Prepared by: Lysle Rubens  Exercises Supine Lower Trunk Rotation - 1 x daily - 7 x weekly - 3 sets - 10 reps - 5 sec hold Supine Bridge - 1 x daily - 7 x weekly - 3 sets - 10 reps - 3 sec hold Seated Hamstring Stretch - 1 x daily - 7 x weekly - 3 sets - 2 reps - 15-20 sec hold Seated Flexion Stretch with Swiss Ball - 1 x daily - 7 x weekly - 3 sets - 5 reps - 3-5 sec hold Seated Thoracic Flexion and Rotation with Swiss Ball - 1 x daily - 7 x weekly - 3 sets - 5 reps - 3-5 sec hold

## 2020-06-13 ENCOUNTER — Encounter: Payer: Self-pay | Admitting: Rehabilitative and Restorative Service Providers"

## 2020-06-13 ENCOUNTER — Other Ambulatory Visit: Payer: Self-pay

## 2020-06-13 ENCOUNTER — Ambulatory Visit: Payer: Medicare Other | Attending: Family Medicine | Admitting: Rehabilitative and Restorative Service Providers"

## 2020-06-13 DIAGNOSIS — M5441 Lumbago with sciatica, right side: Secondary | ICD-10-CM | POA: Diagnosis present

## 2020-06-13 DIAGNOSIS — M5442 Lumbago with sciatica, left side: Secondary | ICD-10-CM | POA: Insufficient documentation

## 2020-06-13 DIAGNOSIS — G8929 Other chronic pain: Secondary | ICD-10-CM | POA: Insufficient documentation

## 2020-06-13 DIAGNOSIS — M6281 Muscle weakness (generalized): Secondary | ICD-10-CM | POA: Diagnosis present

## 2020-06-13 DIAGNOSIS — R252 Cramp and spasm: Secondary | ICD-10-CM | POA: Diagnosis present

## 2020-06-13 NOTE — Therapy (Signed)
[facility] [address] [place], [place], [address] Phone: [phone]   Fax:  [phone]  Physical Therapy Treatment  Patient Details  Name: Briana Newman MRN: [id number] Date of Birth: 17-Jan-1956 Referring Provider (PT): Hilts   Encounter Date: 06/13/2020   PT End of Session - 06/13/20 0927    Visit Number 2    Date for PT Re-Evaluation 08/29/20    PT Start Time 0847    PT Stop Time 0925    PT Time Calculation (min) 38 min    Activity Tolerance Patient tolerated treatment well    Behavior During Therapy [facility] for tasks assessed/performed           Past Medical History:  Diagnosis Date  . Asthma   . GERD (gastroesophageal reflux disease)     Past Surgical History:  Procedure Laterality Date  . PILONIDAL CYST EXCISION      There were no vitals filed for this visit.   Subjective Assessment - 06/13/20 0857    Subjective I forgot to tell her last time, but when I was in the [employer] and they were trying to figure out why my back was hurting, they found out that one leg is shorter than the other.  I am doing okay, my pain is just nagging.    Patient Stated Goals get rid of LBP, learn ex's to get moving again/reduce weight, increase ROM    Currently in Pain? Yes    Pain Score 4     Pain Location Back    Pain Orientation Lower    Pain Descriptors / Indicators Aching;Dull    Pain Type Chronic pain                             [facility] Adult PT Treatment/Exercise - 06/13/20 0001      Lumbar Exercises: Stretches   Active Hamstring Stretch Right;Left;2 reps;20 seconds    Lower Trunk Rotation 5 reps    Other Lumbar Stretch Exercise fwd/lat seated lumbar flexion x2 each, 10 sec hold      Lumbar Exercises: Aerobic   Recumbent Bike L2.0 x79min      Lumbar Exercises: Machines for Strengthening   Cybex Knee Extension 45# 2x10    Cybex Knee Flexion 45# 2x10    Leg Press 40# 2x10      Lumbar Exercises:  Seated   Sit to Stand 10 reps      Lumbar Exercises: Supine   Ab Set 10 reps    Bridge 20 reps;3 seconds      Manual Therapy   Manual Therapy Soft tissue mobilization;Myofascial release    Manual therapy comments lumbar paraspinals, myofascial release to areas of trigger points                    PT Short Term Goals - 06/13/20 0935      PT SHORT TERM GOAL #1   Title Pt will be I with initial HEP    Status On-going             PT Long Term Goals - 06/06/20 1055      PT LONG TERM GOAL #1   Title Pt will be I with advanced HEP    Time 8    Period Weeks    Status New    Target Date 08/01/20      PT LONG TERM GOAL #2   Title Pt  will report 50% reduction in LBP    Time 8    Period Weeks    Status New    Target Date 08/01/20      PT LONG TERM GOAL #3   Title Pt will report resolution of BLE radiating pain    Time 8    Period Weeks    Status New    Target Date 08/01/20      PT LONG TERM GOAL #4   Title Pt will report able to perform ADLs such as mowing lawn and lifting objects around house with no increase in LBP    Time 8    Period Weeks    Status New    Target Date 08/01/20      PT LONG TERM GOAL #5   Title Pt will demo BLE SLR to 85 deg    Baseline 60 deg BLE    Time 8    Period Weeks    Status New    Target Date 08/01/20                 Plan - 06/13/20 7893    Clinical Impression Statement Patient admits to not being as compliant with his HEP as he had hoped, but he states that he is working in the exercises, as able.  He reports decreased pain with manual therapy and states pain decreased to a 0/10 during PT session.  He has fatigue during PT session and requires seated therapeutic recovery periods, especially after Cybex machines.  He had triger points noted in lumbar region, specifically the R side, but these were released with trigger point release and pt reports decreased pain following.  Pt continues to require skilled PT to address  his impairments to allow him to return to his prior independent lifestyle.    PT Treatment/Interventions ADLs/Self Care Home Management;Electrical Stimulation;Iontophoresis 4mg /ml Dexamethasone;Moist Heat;Traction;Neuromuscular re-education;Therapeutic exercise;Therapeutic activities;Stair training;Patient/family education;Manual techniques;Dry needling;Passive range of motion    PT Next Visit Plan LE/lumbar flexibility, core stab, functional strengthening, and manual to lumbar    Consulted and Agree with Plan of Care Patient           Patient will benefit from skilled therapeutic intervention in order to improve the following deficits and impairments:  Abnormal gait,Decreased range of motion,Difficulty walking,Decreased endurance,Increased muscle spasms,Decreased activity tolerance,Pain,Hypomobility,Impaired flexibility,Decreased strength  Visit Diagnosis: Chronic bilateral low back pain with bilateral sciatica  Muscle weakness (generalized)  Cramp and spasm     Problem List Patient Active Problem List   Diagnosis Date Noted  . Osteoarthritis of spine with radiculopathy, cervical region 03/22/2016    05/20/2016, PT, DPT 06/13/2020, 9:37 AM  [facility] [address] [place], [place], [place] Phone: [phone]   Fax:  [phone]  Name: Katrina Brosh MRN: Meyer Cory Date of Birth: Feb 05, 1956

## 2020-06-15 ENCOUNTER — Other Ambulatory Visit: Payer: Self-pay

## 2020-06-15 ENCOUNTER — Encounter: Payer: Self-pay | Admitting: Rehabilitative and Restorative Service Providers"

## 2020-06-15 ENCOUNTER — Ambulatory Visit: Payer: Medicare Other | Admitting: Rehabilitative and Restorative Service Providers"

## 2020-06-15 DIAGNOSIS — M5442 Lumbago with sciatica, left side: Secondary | ICD-10-CM | POA: Diagnosis not present

## 2020-06-15 DIAGNOSIS — G8929 Other chronic pain: Secondary | ICD-10-CM

## 2020-06-15 DIAGNOSIS — R252 Cramp and spasm: Secondary | ICD-10-CM

## 2020-06-15 DIAGNOSIS — M6281 Muscle weakness (generalized): Secondary | ICD-10-CM

## 2020-06-15 NOTE — Therapy (Signed)
[place]. [place], [place], [address] Phone: [phone]   Fax:  [phone]  Physical Therapy Treatment  Patient Details  Name: Thomas Moody MRN: [id number] Date of Birth: 1955-05-25 Referring Provider (PT): Hilts   Encounter Date: 06/15/2020   PT End of Session - 06/15/20 1220    Visit Number 3    Date for PT Re-Evaluation 08/29/20    PT Start Time 1143    PT Stop Time 1222    PT Time Calculation (min) 39 min    Activity Tolerance Patient tolerated treatment well    Behavior During Therapy [facility] for tasks assessed/performed           Past Medical History:  Diagnosis Date  . Asthma   . GERD (gastroesophageal reflux disease)     Past Surgical History:  Procedure Laterality Date  . PILONIDAL CYST EXCISION      There were no vitals filed for this visit.   Subjective Assessment - 06/15/20 1146    Subjective I am feeling better today.    Currently in Pain? Yes    Pain Score 2     Pain Location Back    Pain Orientation Lower    Pain Descriptors / Indicators Aching;Dull    Pain Type Chronic pain    Multiple Pain Sites No                             [facility] Adult PT Treatment/Exercise - 06/15/20 0001      Transfers   Five time sit to stand comments  13.9 seconds with pushing on thighs      Lumbar Exercises: Stretches   Active Hamstring Stretch Right;Left;2 reps;20 seconds    Lower Trunk Rotation 5 reps    Other Lumbar Stretch Exercise fwd/lat seated lumbar flexion x2 each, 10 sec hold      Lumbar Exercises: Aerobic   Recumbent Bike L2.5 x34mn      Lumbar Exercises: Machines for Strengthening   Cybex Knee Extension 45# 2x10    Cybex Knee Flexion 45# 2x10    Leg Press 40# 2x10      Lumbar Exercises: Standing   Heel Raises 15 reps    Other Standing Lumbar Exercises step ups fwd/lat x10 each on 6" step      Lumbar Exercises: Seated   Sit to Stand 10 reps      Lumbar  Exercises: Supine   Bridge 20 reps;3 seconds      Manual Therapy   Manual Therapy Soft tissue mobilization;Myofascial release    Manual therapy comments lumbar paraspinals, myofascial release to areas of trigger points                    PT Short Term Goals - 06/15/20 1227      PT SHORT TERM GOAL #1   Title Pt will be I with initial HEP    Status Partially Met             PT Long Term Goals - 06/15/20 1227      PT LONG TERM GOAL #2   Title Pt will report 50% reduction in LBP    Baseline Pain of 2/10    Status On-going      PT LONG TERM GOAL #3   Title Pt will report resolution of BLE radiating pain    Baseline Denies radiating pain    Status On-going  Plan - 06/15/20 1221    Clinical Impression Statement Pt tolerated session well, states that he has been doing his HEP and trying to get stronger to try to take some of the stress off his back.  He reports that he is starting to feel better.  He tolerated session well and was able to progress with some exercises.  He requires cuing for slowing down on Cybex knee extension/flexion to improve muscle use.  He continues to require skilled PT to progress towards goal related activities.    PT Treatment/Interventions ADLs/Self Care Home Management;Electrical Stimulation;Iontophoresis 16m/ml Dexamethasone;Moist Heat;Traction;Neuromuscular re-education;Therapeutic exercise;Therapeutic activities;Stair training;Patient/family education;Manual techniques;Dry needling;Passive range of motion    PT Next Visit Plan LE/lumbar flexibility, core stab, functional strengthening, and manual to lumbar    Consulted and Agree with Plan of Care Patient           Patient will benefit from skilled therapeutic intervention in order to improve the following deficits and impairments:  Abnormal gait,Decreased range of motion,Difficulty walking,Decreased endurance,Increased muscle spasms,Decreased activity  tolerance,Pain,Hypomobility,Impaired flexibility,Decreased strength  Visit Diagnosis: Chronic bilateral low back pain with bilateral sciatica  Muscle weakness (generalized)  Cramp and spasm     Problem List Patient Active Problem List   Diagnosis Date Noted  . Osteoarthritis of spine with radiculopathy, cervical region 03/22/2016    SJuel Burrow PT, DPT 06/15/2020, 12:29 PM  [place] [address]Phone: [phone]  Fax:  [phone] Name: Thomas HurtadoMRN: [id number] of Birth: 703-02-1955

## 2020-06-20 ENCOUNTER — Ambulatory Visit: Payer: Medicare Other | Admitting: Rehabilitative and Restorative Service Providers"

## 2020-06-20 ENCOUNTER — Encounter: Payer: Self-pay | Admitting: Rehabilitative and Restorative Service Providers"

## 2020-06-20 ENCOUNTER — Other Ambulatory Visit: Payer: Self-pay

## 2020-06-20 DIAGNOSIS — G8929 Other chronic pain: Secondary | ICD-10-CM

## 2020-06-20 DIAGNOSIS — M6281 Muscle weakness (generalized): Secondary | ICD-10-CM

## 2020-06-20 DIAGNOSIS — M5442 Lumbago with sciatica, left side: Secondary | ICD-10-CM

## 2020-06-20 DIAGNOSIS — R252 Cramp and spasm: Secondary | ICD-10-CM

## 2020-06-20 NOTE — Therapy (Signed)
[place] [place], [place], [address] Phone: [phone]   Fax:  [phone]  Physical Therapy Treatment  Patient Details  Name: Angeles Paolucci MRN: [id number] Date of Birth: 04/25/1955 Referring Provider (PT): Hilts   Encounter Date: 06/20/2020   PT End of Session - 06/20/20 1053    Visit Number 4    Date for PT Re-Evaluation 08/29/20    PT Start Time 8676    PT Stop Time 1055    PT Time Calculation (min) 40 min    Activity Tolerance Patient tolerated treatment well    Behavior During Therapy [facility] for tasks assessed/performed           Past Medical History:  Diagnosis Date  . Asthma   . GERD (gastroesophageal reflux disease)     Past Surgical History:  Procedure Laterality Date  . PILONIDAL CYST EXCISION      There were no vitals filed for this visit.   Subjective Assessment - 06/20/20 1036    Subjective I am doing okay today    Pertinent History asthma    Limitations Standing;Walking    Currently in Pain? No/denies                             [facility] Adult PT Treatment/Exercise - 06/20/20 0001      Lumbar Exercises: Stretches   Other Lumbar Stretch Exercise 3 way seated prayer stretch with green theraball x10 each direction, 5 sec hold      Lumbar Exercises: Aerobic   Recumbent Bike L2.5 x80mn      Lumbar Exercises: Machines for Strengthening   Cybex Knee Extension 55# 2x10    Cybex Knee Flexion 45# 2x10    Leg Press 40# 2x10      Lumbar Exercises: Standing   Heel Raises 15 reps    Other Standing Lumbar Exercises step ups fwd/lat x10 each on 6" step      Lumbar Exercises: Seated   Sit to Stand 10 reps    Sit to Stand Limitations with chest press with yellow weighted ball                    PT Short Term Goals - 06/20/20 1059      PT SHORT TERM GOAL #1   Title Pt will be I with initial HEP    Status Partially Met             PT Long Term  Goals - 06/20/20 1059      PT LONG TERM GOAL #2   Title Pt will report 50% reduction in LBP    Baseline Denies pain during session today    Status Partially Met      PT LONG TERM GOAL #3   Title Pt will report resolution of BLE radiating pain    Baseline Denies radiating pain    Status Partially Met      PT LONG TERM GOAL #4   Title Pt will report able to perform ADLs such as mowing lawn and lifting objects around house with no increase in LBP    Status Partially Met      PT LONG TERM GOAL #5   Title Pt will demo BLE SLR to 85 deg    Status On-going                 Plan - 06/20/20 1055  Clinical Impression Statement Patient continues to progress with goal related activities.  Due to his asthma, he does have some dyspnea, but he is able to continue with cuing for pursed lip breathing.  He did complain of any pain during session today.  He continues to have rounded shoulders, but reports a good stretch feeling with the prayer stretch with theraball in sitting.    Comorbidities asthma    Examination-Activity Limitations Lift;Bend    Examination-Participation Restrictions Community Activity;Interpersonal Relationship    PT Treatment/Interventions ADLs/Self Care Home Management;Electrical Stimulation;Iontophoresis 15m/ml Dexamethasone;Moist Heat;Traction;Neuromuscular re-education;Therapeutic exercise;Therapeutic activities;Stair training;Patient/family education;Manual techniques;Dry needling;Passive range of motion    PT Next Visit Plan LE/lumbar flexibility, core stab, functional strengthening, and manual to lumbar    Consulted and Agree with Plan of Care Patient           Patient will benefit from skilled therapeutic intervention in order to improve the following deficits and impairments:  Abnormal gait,Decreased range of motion,Difficulty walking,Decreased endurance,Increased muscle spasms,Decreased activity tolerance,Pain,Hypomobility,Impaired flexibility,Decreased  strength  Visit Diagnosis: Chronic bilateral low back pain with bilateral sciatica  Muscle weakness (generalized)  Cramp and spasm     Problem List Patient Active Problem List   Diagnosis Date Noted  . Osteoarthritis of spine with radiculopathy, cervical region 03/22/2016    SJuel Burrow PT, DPT 06/20/2020, 11:54 AM  [place] [address]Phone: [phone]  Fax:  [phone] Name: JPatrice MoatesMRN: [id number] of Birth: 709/04/1955

## 2020-06-22 ENCOUNTER — Ambulatory Visit: Payer: Medicare Other | Admitting: Rehabilitative and Restorative Service Providers"

## 2020-07-04 ENCOUNTER — Other Ambulatory Visit: Payer: Self-pay

## 2020-07-04 ENCOUNTER — Ambulatory Visit: Payer: Medicare Other | Admitting: Rehabilitative and Restorative Service Providers"

## 2020-07-04 ENCOUNTER — Encounter: Payer: Self-pay | Admitting: Rehabilitative and Restorative Service Providers"

## 2020-07-04 DIAGNOSIS — R252 Cramp and spasm: Secondary | ICD-10-CM

## 2020-07-04 DIAGNOSIS — G8929 Other chronic pain: Secondary | ICD-10-CM

## 2020-07-04 DIAGNOSIS — M5442 Lumbago with sciatica, left side: Secondary | ICD-10-CM | POA: Diagnosis not present

## 2020-07-04 DIAGNOSIS — M6281 Muscle weakness (generalized): Secondary | ICD-10-CM

## 2020-07-04 NOTE — Therapy (Signed)
[address] [place], [place], [address] Phone: [phone]   Fax:  [phone]  Physical Therapy Treatment  Patient Details  Name: Thomas Moody MRN: [id number] Date of Birth: [birth year] Referring Provider (PT): Hilts   Encounter Date: 07/04/2020   PT End of Session - 07/04/20 1020    Visit Number 5    Date for PT Re-Evaluation 08/29/20    PT Start Time 5638    PT Stop Time 1055    PT Time Calculation (min) 40 min    Activity Tolerance Patient tolerated treatment well    Behavior During Therapy [facility] for tasks assessed/performed           Past Medical History:  Diagnosis Date  . Asthma   . GERD (gastroesophageal reflux disease)     Past Surgical History:  Procedure Laterality Date  . PILONIDAL CYST EXCISION      There were no vitals filed for this visit.   Subjective Assessment - 07/04/20 1019    Subjective I am doing okay, just a little bit of pain    Pertinent History asthma    Limitations Standing;Walking    Patient Stated Goals get rid of LBP, learn ex's to get moving again/reduce weight, increase ROM    Currently in Pain? Yes    Pain Score 2     Pain Location Back    Pain Orientation Lower    Pain Descriptors / Indicators Aching;Dull                             [facility] Adult PT Treatment/Exercise - 07/04/20 0001      Lumbar Exercises: Stretches   Other Lumbar Stretch Exercise 3 way seated prayer stretch with green theraball x10 each direction, 5 sec hold      Lumbar Exercises: Aerobic   Recumbent Bike L2.5 x88mn      Lumbar Exercises: Machines for Strengthening   Cybex Knee Extension 55# 2x10    Cybex Knee Flexion 55# 2x10    Leg Press 50# 2x10    Other Lumbar Machine Exercise 4 way Resisted walking with sports cord 30# x3 each      Lumbar Exercises: Standing   Heel Raises 15 reps    Other Standing Lumbar Exercises step ups fwd/lat x10 each on 6" step       Lumbar Exercises: Seated   Sit to Stand 10 reps    Sit to Stand Limitations with chest press with yellow weighted ball      Lumbar Exercises: Supine   Bridge 20 reps;2 seconds    Bridge Limitations LE on orange theraball                    PT Short Term Goals - 07/04/20 1043      PT SHORT TERM GOAL #1   Title Pt will be I with initial HEP    Status Achieved             PT Long Term Goals - 07/04/20 1043      PT LONG TERM GOAL #1   Title Pt will be I with advanced HEP    Status On-going      PT LONG TERM GOAL #2   Title Pt will report 50% reduction in LBP    Baseline 2/10 pain    Status Partially Met      PT LONG TERM GOAL #3  Title Pt will report resolution of BLE radiating pain    Baseline Denies radiating pain    Status Achieved      PT LONG TERM GOAL #4   Title Pt will report able to perform ADLs such as mowing lawn and lifting objects around house with no increase in LBP    Status Partially Met      PT LONG TERM GOAL #5   Title Pt will demo BLE SLR to 85 deg    Baseline 85 degrees on L, 90 degrees R    Status Achieved                 Plan - 07/04/20 1040    Clinical Impression Statement Patient continues to progress towards goal related activities.  He is performing pursed lip breathing approrpriately during session due to his asthma.  No increased complaints of pain during session today.  He has improved in his hamstring flexibility and continues to report relief of back pain/tightness with prayer stretch.    PT Treatment/Interventions ADLs/Self Care Home Management;Electrical Stimulation;Iontophoresis 29m/ml Dexamethasone;Moist Heat;Traction;Neuromuscular re-education;Therapeutic exercise;Therapeutic activities;Stair training;Patient/family education;Manual techniques;Dry needling;Passive range of motion    PT Next Visit Plan LE/lumbar flexibility, core stab, functional strengthening, and manual to lumbar if needed    Consulted and Agree  with Plan of Care Patient           Patient will benefit from skilled therapeutic intervention in order to improve the following deficits and impairments:  Abnormal gait,Decreased range of motion,Difficulty walking,Decreased endurance,Increased muscle spasms,Decreased activity tolerance,Pain,Hypomobility,Impaired flexibility,Decreased strength  Visit Diagnosis: Chronic bilateral low back pain with bilateral sciatica  Muscle weakness (generalized)  Cramp and spasm     Problem List Patient Active Problem List   Diagnosis Date Noted  . Osteoarthritis of spine with radiculopathy, cervical region 03/22/2016    SJuel Burrow PT, DPT 07/04/2020, 11:02 AM  [place] [address]Phone: [phone]  Fax:  [phone] Name: Thomas TucholskiMRN: [id number] of Birth: 7Sep 06, 1957

## 2020-07-06 ENCOUNTER — Other Ambulatory Visit: Payer: Self-pay

## 2020-07-06 ENCOUNTER — Encounter: Payer: Self-pay | Admitting: Rehabilitative and Restorative Service Providers"

## 2020-07-06 ENCOUNTER — Ambulatory Visit: Payer: Medicare Other | Admitting: Rehabilitative and Restorative Service Providers"

## 2020-07-06 DIAGNOSIS — R252 Cramp and spasm: Secondary | ICD-10-CM

## 2020-07-06 DIAGNOSIS — M5442 Lumbago with sciatica, left side: Secondary | ICD-10-CM | POA: Diagnosis not present

## 2020-07-06 DIAGNOSIS — M5441 Lumbago with sciatica, right side: Secondary | ICD-10-CM

## 2020-07-06 DIAGNOSIS — M6281 Muscle weakness (generalized): Secondary | ICD-10-CM

## 2020-07-06 DIAGNOSIS — G8929 Other chronic pain: Secondary | ICD-10-CM

## 2020-07-06 NOTE — Therapy (Signed)
[address] [place], [place], [address] Phone: [phone]   Fax:  [phone]  Physical Therapy Treatment  Patient Details  Name: Thomas Moody MRN: [id number] Date of Birth: 1955-11-30 Referring Provider (PT): Hilts   Encounter Date: 07/06/2020   PT End of Session - 07/06/20 1015    Visit Number 6    Date for PT Re-Evaluation 08/29/20    PT Start Time 1013    PT Stop Time 1055    PT Time Calculation (min) 42 min    Activity Tolerance Patient tolerated treatment well    Behavior During Therapy [facility] for tasks assessed/performed           Past Medical History:  Diagnosis Date  . Asthma   . GERD (gastroesophageal reflux disease)     Past Surgical History:  Procedure Laterality Date  . PILONIDAL CYST EXCISION      There were no vitals filed for this visit.   Subjective Assessment - 07/06/20 1015    Subjective I am feeling better today.    Pertinent History asthma    Limitations Standing;Walking    Currently in Pain? No/denies                             [facility] Adult PT Treatment/Exercise - 07/06/20 0001      Lumbar Exercises: Stretches   Other Lumbar Stretch Exercise 3 way seated prayer stretch with green theraball x5 each direction, 5 sec hold      Lumbar Exercises: Aerobic   Recumbent Bike L2.7 x39mn      Lumbar Exercises: Machines for Strengthening   Cybex Knee Extension 55# 2x10    Cybex Knee Flexion 55# 2x10    Leg Press 50# 2x10    Other Lumbar Machine Exercise 4 way Resisted walking with sports cord 30# x5 each      Lumbar Exercises: Standing   Other Standing Lumbar Exercises step ups fwd/lat x10 each on 6" step      Lumbar Exercises: Seated   Sit to Stand 20 reps    Sit to Stand Limitations 10 with chest press and 10 with overhead press with yellow weighted ball      Lumbar Exercises: Supine   Bridge 20 reps;2 seconds    Bridge Limitations LE on orange  theraball                    PT Short Term Goals - 07/04/20 1043      PT SHORT TERM GOAL #1   Title Pt will be I with initial HEP    Status Achieved             PT Long Term Goals - 07/04/20 1043      PT LONG TERM GOAL #1   Title Pt will be I with advanced HEP    Status On-going      PT LONG TERM GOAL #2   Title Pt will report 50% reduction in LBP    Baseline 2/10 pain    Status Partially Met      PT LONG TERM GOAL #3   Title Pt will report resolution of BLE radiating pain    Baseline Denies radiating pain    Status Achieved      PT LONG TERM GOAL #4   Title Pt will report able to perform ADLs such as mowing lawn and lifting objects around house with  no increase in LBP    Status Partially Met      PT LONG TERM GOAL #5   Title Pt will demo BLE SLR to 85 deg    Baseline 85 degrees on L, 90 degrees R    Status Achieved                 Plan - 07/06/20 1054    Clinical Impression Statement Patient is progressing towards goal related activities.  He tolerated increases in exercises well and overall has decreased dyspnea noted.  He continues to progress with decreased pain and states that he is feeling better.  Requires some cuing during Cybex exercises to slow down to maximize muscle contraction, especially eccentric contraction.    Personal Factors and Comorbidities Comorbidity 1    Comorbidities asthma    PT Treatment/Interventions ADLs/Self Care Home Management;Electrical Stimulation;Iontophoresis 69m/ml Dexamethasone;Moist Heat;Traction;Neuromuscular re-education;Therapeutic exercise;Therapeutic activities;Stair training;Patient/family education;Manual techniques;Dry needling;Passive range of motion    PT Next Visit Plan LE/lumbar flexibility, core stab, functional strengthening, and manual to lumbar if needed    Consulted and Agree with Plan of Care Patient           Patient will benefit from skilled therapeutic intervention in order to improve  the following deficits and impairments:  Abnormal gait,Decreased range of motion,Difficulty walking,Decreased endurance,Increased muscle spasms,Decreased activity tolerance,Pain,Hypomobility,Impaired flexibility,Decreased strength  Visit Diagnosis: Chronic bilateral low back pain with bilateral sciatica  Muscle weakness (generalized)  Cramp and spasm     Problem List Patient Active Problem List   Diagnosis Date Noted  . Osteoarthritis of spine with radiculopathy, cervical region 03/22/2016    SJuel Burrow PT, DPT 07/06/2020, 10:57 AM  [place] [address]Phone: [phone]  Fax:  [phone] Name: JArgelio GranierMRN: [id number] of Birth: 710-05-1955

## 2020-07-11 ENCOUNTER — Ambulatory Visit: Payer: Medicare Other | Admitting: Physical Therapy

## 2020-07-13 ENCOUNTER — Encounter: Payer: Self-pay | Admitting: Physical Therapy

## 2020-07-13 ENCOUNTER — Ambulatory Visit: Payer: Medicare Other | Attending: Family Medicine | Admitting: Physical Therapy

## 2020-07-13 ENCOUNTER — Other Ambulatory Visit: Payer: Self-pay

## 2020-07-13 DIAGNOSIS — M5442 Lumbago with sciatica, left side: Secondary | ICD-10-CM | POA: Diagnosis present

## 2020-07-13 DIAGNOSIS — M5441 Lumbago with sciatica, right side: Secondary | ICD-10-CM | POA: Insufficient documentation

## 2020-07-13 DIAGNOSIS — G8929 Other chronic pain: Secondary | ICD-10-CM | POA: Insufficient documentation

## 2020-07-13 DIAGNOSIS — M6281 Muscle weakness (generalized): Secondary | ICD-10-CM | POA: Insufficient documentation

## 2020-07-13 DIAGNOSIS — R252 Cramp and spasm: Secondary | ICD-10-CM | POA: Insufficient documentation

## 2020-07-13 NOTE — Therapy (Signed)
[address] [place], [place], [address] Phone: [phone]   Fax:  [phone]  Physical Therapy Treatment  Patient Details  Name: Thomas Moody MRN: [id number] Date of Birth: 01-24-1956 Referring Provider (PT): Hilts   Encounter Date: 07/13/2020   PT End of Session - 07/13/20 1111    Visit Number 7    Date for PT Re-Evaluation 08/29/20    PT Start Time 1018    PT Stop Time 1058    PT Time Calculation (min) 40 min    Activity Tolerance Patient tolerated treatment well    Behavior During Therapy [facility] for tasks assessed/performed           Past Medical History:  Diagnosis Date  . Asthma   . GERD (gastroesophageal reflux disease)     Past Surgical History:  Procedure Laterality Date  . PILONIDAL CYST EXCISION      There were no vitals filed for this visit.   Subjective Assessment - 07/13/20 1024    Subjective Pt reports no LBP today; states that he still has some difficulties when getting up in the morning or after strenuous activities such as mowing the yard.    Currently in Pain? No/denies                             [facility] Adult PT Treatment/Exercise - 07/13/20 0001      Lumbar Exercises: Stretches   Active Hamstring Stretch Right;Left;2 reps;20 seconds    Piriformis Stretch Right;Left;1 rep;20 seconds    Piriformis Stretch Limitations seated    Other Lumbar Stretch Exercise 3 way seated prayer stretch with green theraball x5 each direction, 5 sec hold      Lumbar Exercises: Aerobic   UBE (Upper Arm Bike) L2 x 2 min each    Nustep L5 x 6 min      Lumbar Exercises: Machines for Strengthening   Leg Press 50# 2x10    Other Lumbar Machine Exercise 4 way Resisted walking with sports cord 30# x5 each    Other Lumbar Machine Exercise rows and lats 25# 2x10      Lumbar Exercises: Standing   Heel Raises 15 reps    Other Standing Lumbar Exercises step ups fwd/lat x10 each on 6"  step                    PT Short Term Goals - 07/04/20 1043      PT SHORT TERM GOAL #1   Title Pt will be I with initial HEP    Status Achieved             PT Long Term Goals - 07/13/20 1026      PT LONG TERM GOAL #1   Title Pt will be I with advanced HEP    Status Partially Met      PT LONG TERM GOAL #2   Title Pt will report 50% reduction in LBP    Baseline reports 50% improvement in LBP    Status Achieved      PT LONG TERM GOAL #3   Title Pt will report resolution of BLE radiating pain    Baseline Denies radiating pain    Status Achieved      PT LONG TERM GOAL #4   Title Pt will report able to perform ADLs such as mowing lawn and lifting objects around house with no increase in LBP  Baseline reports still some pain with mowing lawn    Status Partially Met      PT LONG TERM GOAL #5   Title Pt will demo BLE SLR to 85 deg    Baseline 85 degrees on L, 90 degrees R    Status Achieved                 Plan - 07/13/20 1112    Clinical Impression Statement Pt continues to make progress toward LTGs. Demos significant improvement in FOTO score from time of eval along with subjective reports of improved symptoms. Does still report difficulty with mowing the lawn and has stiffness/pain in the mornings. Discussed continuance of PT for a few more weeks at least but dropping frequency down to 1x/week with increased emphasis on HEP. Pt verbalized understanding and agreement.    PT Treatment/Interventions ADLs/Self Care Home Management;Electrical Stimulation;Iontophoresis 56m/ml Dexamethasone;Moist Heat;Traction;Neuromuscular re-education;Therapeutic exercise;Therapeutic activities;Stair training;Patient/family education;Manual techniques;Dry needling;Passive range of motion    PT Next Visit Plan LE/lumbar flexibility, core stab, functional strengthening, and manual to lumbar if needed    Consulted and Agree with Plan of Care Patient           Patient will  benefit from skilled therapeutic intervention in order to improve the following deficits and impairments:  Abnormal gait,Decreased range of motion,Difficulty walking,Decreased endurance,Increased muscle spasms,Decreased activity tolerance,Pain,Hypomobility,Impaired flexibility,Decreased strength  Visit Diagnosis: Chronic bilateral low back pain with bilateral sciatica  Muscle weakness (generalized)  Cramp and spasm     Problem List Patient Active Problem List   Diagnosis Date Noted  . Osteoarthritis of spine with radiculopathy, cervical region 03/22/2016   AAmador Moody PT, DPT ADonald ProseSugg 07/13/2020, 11:14 AM  [place] [address]Phone: [phone]  Fax:  [phone] Name: JKiran CarlineMRN: [id number] of Birth: 705/08/1955

## 2020-07-19 ENCOUNTER — Ambulatory Visit: Payer: Medicare Other | Admitting: Physical Therapy

## 2020-07-19 ENCOUNTER — Other Ambulatory Visit: Payer: Self-pay

## 2020-07-19 ENCOUNTER — Encounter: Payer: Self-pay | Admitting: Physical Therapy

## 2020-07-19 DIAGNOSIS — M5442 Lumbago with sciatica, left side: Secondary | ICD-10-CM

## 2020-07-19 DIAGNOSIS — G8929 Other chronic pain: Secondary | ICD-10-CM

## 2020-07-19 DIAGNOSIS — R252 Cramp and spasm: Secondary | ICD-10-CM

## 2020-07-19 DIAGNOSIS — M6281 Muscle weakness (generalized): Secondary | ICD-10-CM

## 2020-07-19 NOTE — Therapy (Signed)
[address]. [place], [place], [address] Phone: [phone]   Fax:  [phone]  Physical Therapy Treatment  Patient Details  Name: Thomas Moody MRN: [id number] Date of Birth: Nov 07, 1955 Referring Provider (PT): Hilts   Encounter Date: 07/19/2020   PT End of Session - 07/19/20 1211    Visit Number 8    Date for PT Re-Evaluation 08/29/20    PT Start Time 1129    PT Stop Time 1211    PT Time Calculation (min) 42 min    Activity Tolerance Patient tolerated treatment well    Behavior During Therapy [facility] for tasks assessed/performed           Past Medical History:  Diagnosis Date  . Asthma   . GERD (gastroesophageal reflux disease)     Past Surgical History:  Procedure Laterality Date  . PILONIDAL CYST EXCISION      There were no vitals filed for this visit.   Subjective Assessment - 07/19/20 1129    Subjective Pt reports no pain today; states about the same with difficulty/stiffness in the morning.    Currently in Pain? No/denies                             [facility] Adult PT Treatment/Exercise - 07/19/20 0001      Lumbar Exercises: Stretches   Active Hamstring Stretch Right;Left;1 rep;30 seconds      Lumbar Exercises: Aerobic   UBE (Upper Arm Bike) L2 x 2 min each    Nustep L5 x 6 min      Lumbar Exercises: Machines for Strengthening   Leg Press 50# 2x10    Other Lumbar Machine Exercise 4 way resisted gait 50# x5 each way    Other Lumbar Machine Exercise rows and lats 25# 2x10; standing shoulder ext 10# 2x10; AR press 10# 1x15 B      Lumbar Exercises: Seated   Sit to Stand 20 reps    Sit to Stand Limitations 10 with chest press and 10 with overhead press with yellow weighted ball                    PT Short Term Goals - 07/04/20 1043      PT SHORT TERM GOAL #1   Title Pt will be I with initial HEP    Status Achieved             PT Long Term Goals -  07/13/20 1026      PT LONG TERM GOAL #1   Title Pt will be I with advanced HEP    Status Partially Met      PT LONG TERM GOAL #2   Title Pt will report 50% reduction in LBP    Baseline reports 50% improvement in LBP    Status Achieved      PT LONG TERM GOAL #3   Title Pt will report resolution of BLE radiating pain    Baseline Denies radiating pain    Status Achieved      PT LONG TERM GOAL #4   Title Pt will report able to perform ADLs such as mowing lawn and lifting objects around house with no increase in LBP    Baseline reports still some pain with mowing lawn    Status Partially Met      PT LONG TERM GOAL #5   Title Pt will demo BLE SLR  to 85 deg    Baseline 85 degrees on L, 90 degrees R    Status Achieved                 Plan - 07/19/20 1211    Clinical Impression Statement Pt tolerated progression of weights/exercises well with no c/o increased LBP this rx. Pt able to tolerate most of session in standing with limited rest breaks no longer than 1 min at a time. Fatigued by end of session but reporting decreased stiffness in LB. Continue to progress ex's.    PT Treatment/Interventions ADLs/Self Care Home Management;Electrical Stimulation;Iontophoresis 31m/ml Dexamethasone;Moist Heat;Traction;Neuromuscular re-education;Therapeutic exercise;Therapeutic activities;Stair training;Patient/family education;Manual techniques;Dry needling;Passive range of motion    PT Next Visit Plan LE/lumbar flexibility, core stab, functional strengthening, and manual to lumbar if needed    Consulted and Agree with Plan of Care Patient           Patient will benefit from skilled therapeutic intervention in order to improve the following deficits and impairments:  Abnormal gait,Decreased range of motion,Difficulty walking,Decreased endurance,Increased muscle spasms,Decreased activity tolerance,Pain,Hypomobility,Impaired flexibility,Decreased strength  Visit Diagnosis: Chronic bilateral  low back pain with bilateral sciatica  Muscle weakness (generalized)  Cramp and spasm     Problem List Patient Active Problem List   Diagnosis Date Noted  . Osteoarthritis of spine with radiculopathy, cervical region 03/22/2016   AAmador Cunas PT, DPT ADonald ProseSugg 07/19/2020, 12:13 PM  [place] [address]Phone: [phone]  Fax:  [phone] Name: JEddy LiszewskiMRN: [id number] of Birth: 712-02-1955

## 2020-07-28 ENCOUNTER — Encounter: Payer: Self-pay | Admitting: Family

## 2020-07-28 ENCOUNTER — Encounter: Payer: Self-pay | Admitting: Physical Therapy

## 2020-07-28 ENCOUNTER — Other Ambulatory Visit: Payer: Self-pay

## 2020-07-28 ENCOUNTER — Ambulatory Visit (INDEPENDENT_AMBULATORY_CARE_PROVIDER_SITE_OTHER): Payer: Medicare Other | Admitting: Family

## 2020-07-28 ENCOUNTER — Ambulatory Visit: Payer: Medicare Other | Admitting: Physical Therapy

## 2020-07-28 VITALS — BP 119/81 | HR 77 | Temp 98.7°F | Resp 15 | Ht 71.77 in | Wt 232.2 lb

## 2020-07-28 DIAGNOSIS — Z131 Encounter for screening for diabetes mellitus: Secondary | ICD-10-CM

## 2020-07-28 DIAGNOSIS — R252 Cramp and spasm: Secondary | ICD-10-CM

## 2020-07-28 DIAGNOSIS — G8929 Other chronic pain: Secondary | ICD-10-CM

## 2020-07-28 DIAGNOSIS — M5442 Lumbago with sciatica, left side: Secondary | ICD-10-CM | POA: Diagnosis not present

## 2020-07-28 DIAGNOSIS — Z13220 Encounter for screening for lipoid disorders: Secondary | ICD-10-CM

## 2020-07-28 DIAGNOSIS — Z0000 Encounter for general adult medical examination without abnormal findings: Secondary | ICD-10-CM

## 2020-07-28 DIAGNOSIS — Z130 Encounter for screening for diseases of the blood and blood-forming organs and certain disorders involving the immune mechanism: Secondary | ICD-10-CM

## 2020-07-28 DIAGNOSIS — H9193 Unspecified hearing loss, bilateral: Secondary | ICD-10-CM

## 2020-07-28 DIAGNOSIS — R21 Rash and other nonspecific skin eruption: Secondary | ICD-10-CM

## 2020-07-28 DIAGNOSIS — M6281 Muscle weakness (generalized): Secondary | ICD-10-CM

## 2020-07-28 DIAGNOSIS — Z1329 Encounter for screening for other suspected endocrine disorder: Secondary | ICD-10-CM

## 2020-07-28 DIAGNOSIS — Z13228 Encounter for screening for other metabolic disorders: Secondary | ICD-10-CM | POA: Diagnosis not present

## 2020-07-28 MED ORDER — TRIAMCINOLONE ACETONIDE 0.1 % EX CREA: 1.0000 "application " | TOPICAL_CREAM | Freq: Two times a day (BID) | CUTANEOUS | 0 refills | Status: DC

## 2020-07-28 NOTE — Therapy (Signed)
[address]. [place], [place], [address] Phone: [phone]   Fax:  [phone]  Physical Therapy Treatment  Patient Details  Name: Thomas Moody MRN: [id number] Date of Birth: [birth year] Referring Provider (PT): Hilts   Encounter Date: 07/28/2020   PT End of Session - 07/28/20 1117     Visit Number 9    Date for PT Re-Evaluation 08/29/20    PT Start Time 1016    PT Stop Time 1055    PT Time Calculation (min) 39 min    Activity Tolerance Patient tolerated treatment well    Behavior During Therapy [facility] for tasks assessed/performed             Past Medical History:  Diagnosis Date   Asthma    GERD (gastroesophageal reflux disease)     Past Surgical History:  Procedure Laterality Date   PILONIDAL CYST EXCISION      There were no vitals filed for this visit.   Subjective Assessment - 07/28/20 1025     Subjective Pt reports 4/10 pain today + fatigue.    Currently in Pain? Yes    Pain Score 4     Pain Location Back                               [facility] Adult PT Treatment/Exercise - 07/28/20 0001       Lumbar Exercises: Stretches   Gastroc Stretch Right;Left;1 rep;30 seconds      Lumbar Exercises: Aerobic   UBE (Upper Arm Bike) L2 x 6 min    Nustep L5 x 4 min      Lumbar Exercises: Machines for Strengthening   Cybex Lumbar Extension black tb 2x15    Other Lumbar Machine Exercise 4 way resisted gait 50# x5 each way    Other Lumbar Machine Exercise rows and lats 25# 2x10; standing shoulder ext 10# 2x10; AR press 10# 1x15 B      Lumbar Exercises: Standing   Heel Raises 15 reps      Lumbar Exercises: Seated   Sit to Stand 20 reps    Sit to Stand Limitations 10 with chest press and 10 with overhead press with yellow weighted ball                      PT Short Term Goals - 07/04/20 1043       PT SHORT TERM GOAL #1   Title Pt will be I with initial HEP     Status Achieved               PT Long Term Goals - 07/13/20 1026       PT LONG TERM GOAL #1   Title Pt will be I with advanced HEP    Status Partially Met      PT LONG TERM GOAL #2   Title Pt will report 50% reduction in LBP    Baseline reports 50% improvement in LBP    Status Achieved      PT LONG TERM GOAL #3   Title Pt will report resolution of BLE radiating pain    Baseline Denies radiating pain    Status Achieved      PT LONG TERM GOAL #4   Title Pt will report able to perform ADLs such as mowing lawn and lifting objects around house with no increase in LBP  Baseline reports still some pain with mowing lawn    Status Partially Met      PT LONG TERM GOAL #5   Title Pt will demo BLE SLR to 85 deg    Baseline 85 degrees on L, 90 degrees R    Status Achieved                   Plan - 07/28/20 1117     Clinical Impression Statement Pt tolerated progression of TE well with no c/o increased LBP. Demos increased SOB and fatigue today; reporting increased tiredness at start of session. Most of session completed standing. Decreased stiffness in LB reported. Continue to progress to tolerance and discuss plan for continuation of PT.    PT Treatment/Interventions ADLs/Self Care Home Management;Electrical Stimulation;Iontophoresis 65m/ml Dexamethasone;Moist Heat;Traction;Neuromuscular re-education;Therapeutic exercise;Therapeutic activities;Stair training;Patient/family education;Manual techniques;Dry needling;Passive range of motion    PT Next Visit Plan LE/lumbar flexibility, core stab, functional strengthening, and manual to lumbar if needed    Consulted and Agree with Plan of Care Patient             Patient will benefit from skilled therapeutic intervention in order to improve the following deficits and impairments:  Abnormal gait, Decreased range of motion, Difficulty walking, Decreased endurance, Increased muscle spasms, Decreased activity tolerance, Pain,  Hypomobility, Impaired flexibility, Decreased strength  Visit Diagnosis: Chronic bilateral low back pain with bilateral sciatica  Muscle weakness (generalized)  Cramp and spasm     Problem List Patient Active Problem List   Diagnosis Date Noted   Osteoarthritis of spine with radiculopathy, cervical region 03/22/2016   AAmador Cunas PT, DPT ADonald ProseSugg 07/28/2020, 11:20 AM  [place] [address]Phone: [phone]  Fax:  [phone] Name: JKirubel AjaMRN: [id number] of Birth: [birth year]

## 2020-07-28 NOTE — Progress Notes (Signed)
Pt presents for medicare annual wellness visit

## 2020-07-28 NOTE — Progress Notes (Signed)
Subjective:   Thomas Moody is a 65 y.o. male who presents for a Welcome to Medicare exam.   Review of Systems: Negative   Cardiac Risk Factors include: advanced age (>86men, >42 women)     Objective:    Today's Vitals   07/28/20 1524  BP: 119/81  Pulse: 77  Resp: 15  Temp: 98.7 F (37.1 C)  SpO2: 92%  Weight: 232 lb 3.2 oz (105.3 kg)  Height: 5' 11.77" (1.823 m)  PainSc: 4    Body mass index is 31.69 kg/m.  Medications Outpatient Encounter Medications as of 07/28/2020  Medication Sig   aspirin 81 MG tablet Take 81 mg by mouth daily.   CINNAMON PO Take 1 tablet by mouth daily.   meclizine (ANTIVERT) 12.5 MG tablet Take 1 tablet (12.5 mg total) by mouth 3 (three) times daily as needed for dizziness.   meloxicam (MOBIC) 7.5 MG tablet Take 1 tablet (7.5 mg total) by mouth daily.   ondansetron (ZOFRAN ODT) 4 MG disintegrating tablet Take 1 tablet (4 mg total) by mouth every 8 (eight) hours as needed for nausea or vomiting.   No facility-administered encounter medications on file as of 07/28/2020.     History: Past Medical History:  Diagnosis Date   Asthma    GERD (gastroesophageal reflux disease)    Past Surgical History:  Procedure Laterality Date   PILONIDAL CYST EXCISION      Family History  Problem Relation Age of Onset   Heart disease Mother    Dementia Father    Hypertension Sister    Diabetes Son    Diabetes type I Son    Diabetes Paternal Grandmother    Diabetes Son    Diabetes type II Son    Autism Son        middle son   Social History   Occupational History   Not on file  Tobacco Use   Smoking status: Never   Smokeless tobacco: Never  Vaping Use   Vaping Use: Never used  Substance and Sexual Activity   Alcohol use: Not Currently    Alcohol/week: 0.0 standard drinks    Comment: occ   Drug use: No   Sexual activity: Yes   Tobacco Counseling Counseling given: Not Answered   Immunizations and Health Maintenance Immunization  History  Administered Date(s) Administered   Influenza Split 02/11/2014   Influenza, Quadrivalent, Recombinant, Inj, Pf 02/09/2018   Influenza,inj,Quad PF,6+ Mos 01/11/2016   PFIZER(Purple Top)SARS-COV-2 Vaccination 04/19/2019, 05/19/2019   Pneumococcal Polysaccharide-23 04/08/2014   Tdap 04/08/2014   Health Maintenance Due  Topic Date Due   Zoster Vaccines- Shingrix (1 of 2) Never done    Activities of Daily Living In your present state of health, do you have any difficulty performing the following activities: 07/28/2020 05/10/2020  Hearing? N N  Vision? N N  Difficulty concentrating or making decisions? N N  Walking or climbing stairs? N N  Dressing or bathing? N N  Doing errands, shopping? N N  Preparing Food and eating ? N -  Using the Toilet? N -  In the past six months, have you accidently leaked urine? N -  Do you have problems with loss of bowel control? N -  Managing your Medications? N -  Managing your Finances? N -  Housekeeping or managing your Housekeeping? N -  Some recent data might be hidden    Physical Exam: General appearance - alert, well appearing, and in no distress and oriented to person,  place, and time Mental status - alert, oriented to person, place, and time, normal mood, behavior, speech, dress, motor activity, and thought processes Eyes - pupils equal and reactive, extraocular eye movements intact Ears - bilateral TM's and external ear canals normal Neck - supple, no significant adenopathy Lymphatics - no palpable lymphadenopathy, no hepatosplenomegaly Chest - clear to auscultation, no wheezes, rales or rhonchi, symmetric air entry, no tachypnea, retractions or cyanosis Heart - normal rate, regular rhythm, normal S1, S2, no murmurs, rubs, clicks or gallops Abdomen - soft, nontender, nondistended, no masses or organomegaly GU Male - patient declined exam Neurological - alert, oriented, normal speech, no focal findings or movement disorder noted,  cranial nerves II through XII intact, funduscopic exam normal, discs flat and sharp, DTR's normal and symmetric, motor and sensory grossly normal bilaterally, normal muscle tone, no tremors, strength 5/5 Musculoskeletal - no joint tenderness, deformity or swelling, no muscular tenderness noted Extremities - peripheral pulses normal, no pedal edema, no clubbing or cyanosis Skin - normal coloration and turgor, no suspicious skin lesions noted, hyperpigmented coarse rash lower midline back/sacral area  Advanced Directives: Discussed with patient what is a Stage manager. I went over with him Living Will and Healthcare Power of [place].  Patient given written packet also.  He will look over it and consider executing a Living Will.  If he does, I requested that he brings a copy of it for our records.  Assessment:    This is a routine wellness  examination for this patient .   Vision/Hearing screen Would like referral to ear doctor for hearing screening. Last visit at [facility] about 2 months ago.   Dietary issues and exercise activities discussed: yes   Goal(s): I would like to loose 15 to 20 pounds within the next 1 year.   Depression Screen PHQ 2/9 Scores 07/28/2020 05/10/2020 03/03/2018 03/22/2016  PHQ - 2 Score 0 0 0 0  PHQ- 9 Score - 5 8 -     Fall Risk Fall Risk  07/28/2020  Falls in the past year? 0  Number falls in past yr: 0  Injury with Fall? 0  Risk for fall due to : No Fall Risks  Follow up Falls evaluation completed    Cognitive Function MMSE - Mini Mental State Exam 07/28/2020  Orientation to time 5  Orientation to Place 5  Registration 3  Attention/ Calculation 5  Recall 3  Language- name 2 objects 2  Language- repeat 1  Language- follow 3 step command 3  Language- read & follow direction 1  Write a sentence 1  Copy design 1  Total score 30    Patient Care Team: Rema Fendt, NP as PCP - General (Nurse Practitioner)    Plan:  1. Welcome  to Medicare preventive visit: - Counseled on 150 minutes of exercise per week as tolerated, healthy eating (including decreased daily intake of saturated fats, cholesterol, added sugars, sodium), STI prevention, and routine healthcare maintenance.  2. Screening for metabolic disorder: - CMP to check kidney function, liver function, and electrolyte balance.  - Comprehensive metabolic panel  3. Screening for deficiency anemia: - CBC to screen for anemia. - CBC  4. Diabetes mellitus screening: - Hemoglobin A1c to screen for pre-diabetes/diabetes. - Hemoglobin A1c  5. Screening cholesterol level: - Lipid panel to screen for high cholesterol.  - Lipid panel  6. Thyroid disorder screen: - TSH to check thyroid function.  - TSH  7. Rash and nonspecific skin  eruption: - Triamcinolone cream as prescribed.  - Follow-up with primary provider as scheduled.  - triamcinolone cream (KENALOG) 0.1 %; Apply 1 application topically 2 (two) times daily.  Dispense: 80 g; Refill: 0  8. Decreased hearing of both ears: - Referral to Audiology for further evaluation and management.  - Ambulatory referral to Audiology   I have personally reviewed and noted the following in the patient's chart:   Medical and social history Use of alcohol, tobacco or illicit drugs  Current medications and supplements Functional ability and status Nutritional status Physical activity Advanced directives List of other physicians Hospitalizations, surgeries, and ER visits in previous 12 months Vitals Screenings to include cognitive, depression, and falls Referrals and appointments  In addition, I have reviewed and discussed with patient certain preventive protocols, quality metrics, and best practice recommendations. A written personalized care plan for preventive services as well as general preventive health recommendations were provided to patient.    Rema Fendt, NP 07/28/2020

## 2020-07-28 NOTE — Patient Instructions (Signed)
Preventive Care 65 Years and Older, Male Preventive care refers to lifestyle choices and visits with your health care provider that can promote health and wellness. This includes: A yearly physical exam. This is also called an annual wellness visit. Regular dental and eye exams. Immunizations. Screening for certain conditions. Healthy lifestyle choices, such as: Eating a healthy diet. Getting regular exercise. Not using drugs or products that contain nicotine and tobacco. Limiting alcohol use. What can I expect for my preventive care visit? Physical exam Your health care provider will check your: Height and weight. These may be used to calculate your BMI (body mass index). BMI is a measurement that tells if you are at a healthy weight. Heart rate and blood pressure. Body temperature. Skin for abnormal spots. Counseling Your health care provider may ask you questions about your: Past medical problems. Family's medical history. Alcohol, tobacco, and drug use. Emotional well-being. Home life and relationship well-being. Sexual activity. Diet, exercise, and sleep habits. History of falls. Memory and ability to understand (cognition). Work and work environment. Access to firearms. What immunizations do I need?  Vaccines are usually given at various ages, according to a schedule. Your health care provider will recommend vaccines for you based on your age, medicalhistory, and lifestyle or other factors, such as travel or where you work. What tests do I need? Blood tests Lipid and cholesterol levels. These may be checked every 5 years, or more often depending on your overall health. Hepatitis C test. Hepatitis B test. Screening Lung cancer screening. You may have this screening every year starting at age 55 if you have a 30-pack-year history of smoking and currently smoke or have quit within the past 15 years. Colorectal cancer screening. All adults should have this screening  starting at age 50 and continuing until age 75. Your health care provider may recommend screening at age 45 if you are at increased risk. You will have tests every 1-10 years, depending on your results and the type of screening test. Prostate cancer screening. Recommendations will vary depending on your family history and other risks. Genital exam to check for testicular cancer or hernias. Diabetes screening. This is done by checking your blood sugar (glucose) after you have not eaten for a while (fasting). You may have this done every 1-3 years. Abdominal aortic aneurysm (AAA) screening. You may need this if you are a current or former smoker. STD (sexually transmitted disease) testing, if you are at risk. Follow these instructions at home: Eating and drinking  Eat a diet that includes fresh fruits and vegetables, whole grains, lean protein, and low-fat dairy products. Limit your intake of foods with high amounts of sugar, saturated fats, and salt. Take vitamin and mineral supplements as recommended by your health care provider. Do not drink alcohol if your health care provider tells you not to drink. If you drink alcohol: Limit how much you have to 0-2 drinks a day. Be aware of how much alcohol is in your drink. In the [place] one drink equals one 12 oz bottle of beer (355 mL), one 5 oz glass of wine (148 mL), or one 1 oz glass of hard liquor (44 mL).  Lifestyle Take daily care of your teeth and gums. Brush your teeth every morning and night with fluoride toothpaste. Floss one time each day. Stay active. Exercise for at least 30 minutes 5 or more days each week. Do not use any products that contain nicotine or tobacco, such as cigarettes, e-cigarettes, and chewing tobacco.   If you need help quitting, ask your health care provider. Do not use drugs. If you are sexually active, practice safe sex. Use a condom or other form of protection to prevent STIs (sexually transmitted infections). Talk  with your health care provider about taking a low-dose aspirin or statin. Find healthy ways to cope with stress, such as: Meditation, yoga, or listening to music. Journaling. Talking to a trusted person. Spending time with friends and family. Safety Always wear your seat belt while driving or riding in a vehicle. Do not drive: If you have been drinking alcohol. Do not ride with someone who has been drinking. When you are tired or distracted. While texting. Wear a helmet and other protective equipment during sports activities. If you have firearms in your house, make sure you follow all gun safety procedures. What's next? Visit your health care provider once a year for an annual wellness visit. Ask your health care provider how often you should have your eyes and teeth checked. Stay up to date on all vaccines. This information is not intended to replace advice given to you by your health care provider. Make sure you discuss any questions you have with your healthcare provider. Document Revised: 10/27/2018 Document Reviewed: 01/22/2018 Elsevier Patient Education  2022 Elsevier Inc.  

## 2020-07-29 ENCOUNTER — Other Ambulatory Visit: Payer: Self-pay | Admitting: Family

## 2020-07-29 ENCOUNTER — Encounter: Payer: Self-pay | Admitting: Family

## 2020-07-29 DIAGNOSIS — E785 Hyperlipidemia, unspecified: Secondary | ICD-10-CM | POA: Insufficient documentation

## 2020-07-29 DIAGNOSIS — R7303 Prediabetes: Secondary | ICD-10-CM | POA: Insufficient documentation

## 2020-07-29 LAB — COMPREHENSIVE METABOLIC PANEL
ALT: 28 IU/L (ref 0–44)
AST: 16 IU/L (ref 0–40)
Albumin/Globulin Ratio: 1.6 (ref 1.2–2.2)
Albumin: 4.5 g/dL (ref 3.8–4.8)
Alkaline Phosphatase: 67 IU/L (ref 44–121)
BUN/Creatinine Ratio: 10 (ref 10–24)
BUN: 12 mg/dL (ref 8–27)
Bilirubin Total: 0.6 mg/dL (ref 0.0–1.2)
CO2: 20 mmol/L (ref 20–29)
Calcium: 9.4 mg/dL (ref 8.6–10.2)
Chloride: 108 mmol/L — ABNORMAL HIGH (ref 96–106)
Creatinine, Ser: 1.17 mg/dL (ref 0.76–1.27)
Globulin, Total: 2.8 g/dL (ref 1.5–4.5)
Glucose: 81 mg/dL (ref 65–99)
Potassium: 3.8 mmol/L (ref 3.5–5.2)
Sodium: 141 mmol/L (ref 134–144)
Total Protein: 7.3 g/dL (ref 6.0–8.5)
eGFR: 69 mL/min/{1.73_m2} (ref >59)

## 2020-07-29 LAB — CBC
Hematocrit: 44.8 % (ref 37.5–51.0)
Hemoglobin: 15.3 g/dL (ref 13.0–17.7)
MCH: 29.8 pg (ref 26.6–33.0)
MCHC: 34.2 g/dL (ref 31.5–35.7)
MCV: 87 fL (ref 79–97)
Platelets: 260 10*3/uL (ref 150–450)
RBC: 5.13 x10E6/uL (ref 4.14–5.80)
RDW: 14.8 % (ref 11.6–15.4)
WBC: 3.2 10*3/uL — ABNORMAL LOW (ref 3.4–10.8)

## 2020-07-29 LAB — LIPID PANEL
Chol/HDL Ratio: 3.4 ratio (ref 0.0–5.0)
Cholesterol, Total: 179 mg/dL (ref 100–199)
HDL: 53 mg/dL (ref >39)
LDL Chol Calc (NIH): 112 mg/dL — ABNORMAL HIGH (ref 0–99)
Triglycerides: 72 mg/dL (ref 0–149)
VLDL Cholesterol Cal: 14 mg/dL (ref 5–40)

## 2020-07-29 LAB — TSH: TSH: 1.6 u[IU]/mL (ref 0.450–4.500)

## 2020-07-29 LAB — HEMOGLOBIN A1C
Est. average glucose Bld gHb Est-mCnc: 131 mg/dL
Hgb A1c MFr Bld: 6.2 % — ABNORMAL HIGH (ref 4.8–5.6)

## 2020-07-29 MED ORDER — ATORVASTATIN CALCIUM 40 MG PO TABS: 40.0000 mg | ORAL_TABLET | Freq: Every day | ORAL | 0 refills | Status: DC

## 2020-07-29 NOTE — Progress Notes (Signed)
Kidney function normal.   Liver function normal.   Thyroid function normal.   No anemia.   Hemoglobin A1c is consistent with pre-diabetes. Practice healthy eating habits of fresh fruit and vegetables, lean baked meats such as chicken, fish, and [place]; limit breads, rice, pastas, and desserts; practice regular aerobic exercise (at least 150 minutes a week as tolerated). No medication management at this time. Encouraged to recheck in 6 months.   Cholesterol higher than expected. High cholesterol may increase risk of heart attack and/or stroke. Consider eating more fruits, vegetables, and lean baked meats such as chicken or fish. Moderate intensity exercise at least 150 minutes as tolerated per week may help as well.   Atorvastatin prescribed for high cholesterol. Encouraged to recheck in 3 to 6 months.   The following is for provider reference only: The 10-year ASCVD risk score is: 8.3%   Values used to calculate the score:     Age: 65 years     Sex: Male     Is Non-Hispanic African American: Yes     Diabetic: No     Tobacco smoker: No     Systolic Blood Pressure: 119 mmHg     Is BP treated: No     HDL Cholesterol: 53 mg/dL     Total Cholesterol: 179 mg/dL

## 2020-08-04 ENCOUNTER — Ambulatory Visit: Payer: Medicare Other | Admitting: Physical Therapy

## 2020-08-04 ENCOUNTER — Other Ambulatory Visit: Payer: Self-pay

## 2020-08-04 ENCOUNTER — Encounter: Payer: Self-pay | Admitting: Physical Therapy

## 2020-08-04 DIAGNOSIS — M5442 Lumbago with sciatica, left side: Secondary | ICD-10-CM | POA: Diagnosis not present

## 2020-08-04 DIAGNOSIS — G8929 Other chronic pain: Secondary | ICD-10-CM

## 2020-08-04 DIAGNOSIS — R252 Cramp and spasm: Secondary | ICD-10-CM

## 2020-08-04 DIAGNOSIS — M6281 Muscle weakness (generalized): Secondary | ICD-10-CM

## 2020-08-04 NOTE — Therapy (Signed)
[address] [place], [place], [address] Phone: [phone]   Fax:  [phone]  Physical Therapy Treatment Progress Note Reporting Period 06/06/20 to 08/04/20  See note below for Objective Data and Assessment of Progress/Goals.     Patient Details  Name: Thomas Moody MRN: [id number] Date of Birth: 07-Mar-1955 Referring Provider (PT): Hilts   Encounter Date: 08/04/2020   PT End of Session - 08/04/20 1133     Visit Number 10    Date for PT Re-Evaluation 08/29/20    PT Start Time 1017    PT Stop Time 1056    PT Time Calculation (min) 39 min    Activity Tolerance Patient tolerated treatment well    Behavior During Therapy WFL for tasks assessed/performed             Past Medical History:  Diagnosis Date   Asthma    GERD (gastroesophageal reflux disease)     Past Surgical History:  Procedure Laterality Date   PILONIDAL CYST EXCISION      There were no vitals filed for this visit.   Subjective Assessment - 08/04/20 1022     Subjective Pt reports no pain today; feeling good overall    Currently in Pain? No/denies    Pain Score 0-No pain                               [facility] Adult PT Treatment/Exercise - 08/04/20 0001       Lumbar Exercises: Aerobic   UBE (Upper Arm Bike) L2 x 2 min each way    Nustep L5 x 6 min      Lumbar Exercises: Machines for Strengthening   Cybex Knee Extension 20# 2x10    Cybex Knee Flexion 35# 2x10    Leg Press 50# 2x10    Other Lumbar Machine Exercise 4 way resisted gait 50# x5 each way    Other Lumbar Machine Exercise rows and lats 25# 2x10; standing shoulder ext 10# 2x10; AR press 15# 1x15 B      Lumbar Exercises: Standing   Heel Raises 15 reps                      PT Short Term Goals - 07/04/20 1043       PT SHORT TERM GOAL #1   Title Pt will be I with initial HEP    Status Achieved               PT Long Term  Goals - 08/04/20 1134       PT LONG TERM GOAL #1   Title Pt will be I with advanced HEP    Status Partially Met      PT LONG TERM GOAL #2   Title Pt will report 50% reduction in LBP    Baseline reports 50% improvement in LBP    Status Achieved      PT LONG TERM GOAL #3   Title Pt will report resolution of BLE radiating pain    Baseline Denies radiating pain    Status Achieved      PT LONG TERM GOAL #4   Title Pt will report able to perform ADLs such as mowing lawn and lifting objects around house with no increase in LBP    Baseline reports still some pain with mowing lawn    Status Partially Met  PT LONG TERM GOAL #5   Title Pt will demo BLE SLR to 85 deg    Baseline 85 degrees on L, 90 degrees R    Status Achieved                   Plan - 08/04/20 1134     Clinical Impression Statement Pt is progressing towards LTGs. Demos improved lumbar AROM along with subjective improvements in ADL performance with decreased LBP. Discussed continuation of PT; plan to reduce frequency to 1x/week. Focus on lumbar stab along with functional LE strenthening and flexibility. Working towards d/c at the last scheduled rx.    PT Treatment/Interventions ADLs/Self Care Home Management;Electrical Stimulation;Iontophoresis 3m/ml Dexamethasone;Moist Heat;Traction;Neuromuscular re-education;Therapeutic exercise;Therapeutic activities;Stair training;Patient/family education;Manual techniques;Dry needling;Passive range of motion    PT Next Visit Plan LE/lumbar flexibility, core stab, functional strengthening, and manual to lumbar if needed    Consulted and Agree with Plan of Care Patient             Patient will benefit from skilled therapeutic intervention in order to improve the following deficits and impairments:  Abnormal gait, Decreased range of motion, Difficulty walking, Decreased endurance, Increased muscle spasms, Decreased activity tolerance, Pain, Hypomobility, Impaired  flexibility, Decreased strength  Visit Diagnosis: Chronic bilateral low back pain with bilateral sciatica  Muscle weakness (generalized)  Cramp and spasm     Problem List Patient Active Problem List   Diagnosis Date Noted   Prediabetes 07/29/2020   Hyperlipidemia 07/29/2020   Osteoarthritis of spine with radiculopathy, cervical region 03/22/2016   AAmador Cunas PT, DPT ADonald ProseSugg 08/04/2020, 11:36 AM  [place] [address]Phone: [phone]  Fax:  [phone] Name: Thomas BartowMRN: [id number] of Birth: 706/09/1955

## 2020-08-11 ENCOUNTER — Encounter: Payer: Self-pay | Admitting: Physical Therapy

## 2020-08-11 ENCOUNTER — Ambulatory Visit: Payer: Medicare Other | Attending: Family Medicine | Admitting: Physical Therapy

## 2020-08-11 ENCOUNTER — Other Ambulatory Visit: Payer: Self-pay

## 2020-08-11 DIAGNOSIS — H9313 Tinnitus, bilateral: Secondary | ICD-10-CM | POA: Insufficient documentation

## 2020-08-11 DIAGNOSIS — M5441 Lumbago with sciatica, right side: Secondary | ICD-10-CM | POA: Insufficient documentation

## 2020-08-11 DIAGNOSIS — R252 Cramp and spasm: Secondary | ICD-10-CM | POA: Diagnosis present

## 2020-08-11 DIAGNOSIS — G8929 Other chronic pain: Secondary | ICD-10-CM | POA: Diagnosis present

## 2020-08-11 DIAGNOSIS — H903 Sensorineural hearing loss, bilateral: Secondary | ICD-10-CM | POA: Insufficient documentation

## 2020-08-11 DIAGNOSIS — M6281 Muscle weakness (generalized): Secondary | ICD-10-CM | POA: Insufficient documentation

## 2020-08-11 DIAGNOSIS — M5442 Lumbago with sciatica, left side: Secondary | ICD-10-CM | POA: Insufficient documentation

## 2020-08-11 NOTE — Therapy (Signed)
[address] [place], [place], [address] Phone: [phone]   Fax:  [phone]  Physical Therapy Treatment  Patient Details  Name: Thomas Moody MRN: [id number] Date of Birth: Mar 27, 1955 Referring Provider (PT): Hilts   Encounter Date: 08/11/2020   PT End of Session - 08/11/20 1140     Visit Number 11    Date for PT Re-Evaluation 08/29/20    PT Start Time 1109    PT Stop Time 1141    PT Time Calculation (min) 32 min    Activity Tolerance Patient tolerated treatment well    Behavior During Therapy [facility] for tasks assessed/performed             Past Medical History:  Diagnosis Date   Asthma    GERD (gastroesophageal reflux disease)     Past Surgical History:  Procedure Laterality Date   PILONIDAL CYST EXCISION      There were no vitals filed for this visit.   Subjective Assessment - 08/11/20 1112     Subjective Pt states he is having L heel pain radiating into medial plantar arch of foot; began 2 days ago, no known MOI. Worse in the morning and when he first gets up, better with exercise.    Currently in Pain? Yes   rates mild pain plantar surface of L foot   Pain Score 0-No pain    Pain Location Back                               [facility] Adult PT Treatment/Exercise - 08/11/20 0001       Self-Care   Self-Care Other Self-Care Comments    Other Self-Care Comments  education on utilizing ice (frozen water bottle) for plantar surface of foot, also may use tennis ball for pressure on plantar surface of foot to tolerance      Lumbar Exercises: Aerobic   UBE (Upper Arm Bike) L2 x 2 min each way    Nustep L5 x 32mn      Lumbar Exercises: Machines for Strengthening   Cybex Lumbar Extension black tb 2x10    Cybex Knee Extension 20# 2x10    Cybex Knee Flexion 35# 2x10    Leg Press 50# 2x10 BLE    Other Lumbar Machine Exercise rows and lats 25# 2x10      Modalities   Modalities  Iontophoresis      Iontophoresis   Type of Iontophoresis Dexamethasone    Location plantar surface of L foot    Dose 80 mA    Time 4 hr                      PT Short Term Goals - 07/04/20 1043       PT SHORT TERM GOAL #1   Title Pt will be I with initial HEP    Status Achieved               PT Long Term Goals - 08/04/20 1134       PT LONG TERM GOAL #1   Title Pt will be I with advanced HEP    Status Partially Met      PT LONG TERM GOAL #2   Title Pt will report 50% reduction in LBP    Baseline reports 50% improvement in LBP    Status Achieved      PT LONG TERM  GOAL #3   Title Pt will report resolution of BLE radiating pain    Baseline Denies radiating pain    Status Achieved      PT LONG TERM GOAL #4   Title Pt will report able to perform ADLs such as mowing lawn and lifting objects around house with no increase in LBP    Baseline reports still some pain with mowing lawn    Status Partially Met      PT LONG TERM GOAL #5   Title Pt will demo BLE SLR to 85 deg    Baseline 85 degrees on L, 90 degrees R    Status Achieved                   Plan - 08/11/20 1141     Clinical Impression Statement Pt arrived ~8 min late, so abbreviated session this rx. Pt presents to clinic reporting new L foot pain on plantar surface from heel to medial arch. Educated on use of ice with frozen water bottle and gentle STM with tennis ball to tolerance. Applied ionto to foot for pain relief, assess response next rx. Omitted standing ex's from today's session. Tolerated lumbar ex's well with no c/o increase in LBP.    PT Treatment/Interventions ADLs/Self Care Home Management;Electrical Stimulation;Iontophoresis 55m/ml Dexamethasone;Moist Heat;Traction;Neuromuscular re-education;Therapeutic exercise;Therapeutic activities;Stair training;Patient/family education;Manual techniques;Dry needling;Passive range of motion    PT Next Visit Plan LE/lumbar flexibility, core  stab, functional strengthening, and manual to lumbar if needed    Consulted and Agree with Plan of Care Patient             Patient will benefit from skilled therapeutic intervention in order to improve the following deficits and impairments:  Abnormal gait, Decreased range of motion, Difficulty walking, Decreased endurance, Increased muscle spasms, Decreased activity tolerance, Pain, Hypomobility, Impaired flexibility, Decreased strength  Visit Diagnosis: Chronic bilateral low back pain with bilateral sciatica  Muscle weakness (generalized)  Cramp and spasm     Problem List Patient Active Problem List   Diagnosis Date Noted   Prediabetes 07/29/2020   Hyperlipidemia 07/29/2020   Osteoarthritis of spine with radiculopathy, cervical region 03/22/2016   Thomas Moody PT, DPT ADonald ProseSugg 08/11/2020, 11:44 AM  [place] [address]Phone: [phone]  Fax:  [phone] Name: Thomas NallMRN: [id number] of Birth: 702-May-1957

## 2020-08-14 ENCOUNTER — Other Ambulatory Visit: Payer: Self-pay | Admitting: Family

## 2020-08-14 DIAGNOSIS — G8929 Other chronic pain: Secondary | ICD-10-CM

## 2020-08-18 ENCOUNTER — Ambulatory Visit: Payer: Medicare Other | Admitting: Physical Therapy

## 2020-08-18 ENCOUNTER — Encounter: Payer: Self-pay | Admitting: Physical Therapy

## 2020-08-18 ENCOUNTER — Other Ambulatory Visit: Payer: Self-pay

## 2020-08-18 DIAGNOSIS — M6281 Muscle weakness (generalized): Secondary | ICD-10-CM

## 2020-08-18 DIAGNOSIS — R252 Cramp and spasm: Secondary | ICD-10-CM

## 2020-08-18 DIAGNOSIS — G8929 Other chronic pain: Secondary | ICD-10-CM

## 2020-08-18 DIAGNOSIS — M5442 Lumbago with sciatica, left side: Secondary | ICD-10-CM | POA: Diagnosis not present

## 2020-08-18 NOTE — Therapy (Signed)
[place] [place], [place], [address] Phone: [phone]   Fax:  [phone]  Physical Therapy Treatment  Patient Details  Name: Raymund Manrique MRN: [id number] Date of Birth: [birth year] Referring Provider (PT): Hilts   Encounter Date: 08/18/2020   PT End of Session - 08/18/20 1027     Visit Number 12    Date for PT Re-Evaluation 08/29/20    PT Start Time 0929    PT Stop Time 1011    PT Time Calculation (min) 42 min    Activity Tolerance Patient tolerated treatment well    Behavior During Therapy [facility] for tasks assessed/performed             Past Medical History:  Diagnosis Date   Asthma    GERD (gastroesophageal reflux disease)     Past Surgical History:  Procedure Laterality Date   PILONIDAL CYST EXCISION      There were no vitals filed for this visit.   Subjective Assessment - 08/18/20 0938     Subjective Pt states L heel pain has completely resolved since use of ionto. No LBP today.    Currently in Pain? No/denies    Pain Score 0-No pain    Pain Location Back                               [facility] Adult PT Treatment/Exercise - 08/18/20 0001       Lumbar Exercises: Stretches   Gastroc Stretch Right;Left;1 rep;30 seconds      Lumbar Exercises: Aerobic   UBE (Upper Arm Bike) L2 x 2 min each way    Nustep L5 x 6 min      Lumbar Exercises: Machines for Strengthening   Cybex Lumbar Extension black tb 2x10    Cybex Knee Extension 20# 2x10    Cybex Knee Flexion 35# 2x10    Leg Press 50# 2x10 BLE, 50# 2x10 heel raises    Other Lumbar Machine Exercise standing shoulder ext 15# 2x10, AR press 15# 1x10 B    Other Lumbar Machine Exercise rows and lats 25# 2x10      Lumbar Exercises: Standing   Heel Raises 15 reps                      PT Short Term Goals - 07/04/20 1043       PT SHORT TERM GOAL #1   Title Pt will be I with initial HEP    Status Achieved                PT Long Term Goals - 08/04/20 1134       PT LONG TERM GOAL #1   Title Pt will be I with advanced HEP    Status Partially Met      PT LONG TERM GOAL #2   Title Pt will report 50% reduction in LBP    Baseline reports 50% improvement in LBP    Status Achieved      PT LONG TERM GOAL #3   Title Pt will report resolution of BLE radiating pain    Baseline Denies radiating pain    Status Achieved      PT LONG TERM GOAL #4   Title Pt will report able to perform ADLs such as mowing lawn and lifting objects around house with no increase in LBP    Baseline reports still some  pain with mowing lawn    Status Partially Met      PT LONG TERM GOAL #5   Title Pt will demo BLE SLR to 85 deg    Baseline 85 degrees on L, 90 degrees R    Status Achieved                   Plan - 08/18/20 1028     Clinical Impression Statement Pt tolerated progression of TE well with no c/o increased LBP. Reports that L foot pain has resolved following ionto last rx; follow up next session to ensure continued pain relief. Discussed next session being discharge with updated HEP. Pt VU and agreement.    PT Treatment/Interventions ADLs/Self Care Home Management;Electrical Stimulation;Iontophoresis 56m/ml Dexamethasone;Moist Heat;Traction;Neuromuscular re-education;Therapeutic exercise;Therapeutic activities;Stair training;Patient/family education;Manual techniques;Dry needling;Passive range of motion    PT Next Visit Plan LE/lumbar flexibility, core stab, functional strengthening, and manual to lumbar if needed    Consulted and Agree with Plan of Care Patient             Patient will benefit from skilled therapeutic intervention in order to improve the following deficits and impairments:  Abnormal gait, Decreased range of motion, Difficulty walking, Decreased endurance, Increased muscle spasms, Decreased activity tolerance, Pain, Hypomobility, Impaired flexibility, Decreased  strength  Visit Diagnosis: Chronic bilateral low back pain with bilateral sciatica  Muscle weakness (generalized)  Cramp and spasm     Problem List Patient Active Problem List   Diagnosis Date Noted   Prediabetes 07/29/2020   Hyperlipidemia 07/29/2020   Osteoarthritis of spine with radiculopathy, cervical region 03/22/2016   AAmador Cunas PT, DPT ADonald ProseSugg 08/18/2020, 10:34 AM  [place] [id number]: [phone]  Fax:  [phone] Name: JBoleslaus HollowayMRN: [id number] of Birth: 720-Apr-1957

## 2020-08-25 ENCOUNTER — Ambulatory Visit: Payer: Medicare Other | Admitting: Physical Therapy

## 2020-09-04 ENCOUNTER — Ambulatory Visit: Payer: Medicare Other | Admitting: Audiologist

## 2020-09-07 ENCOUNTER — Ambulatory Visit: Payer: Medicare Other | Admitting: Audiologist

## 2020-09-07 ENCOUNTER — Other Ambulatory Visit: Payer: Self-pay

## 2020-09-07 DIAGNOSIS — H9313 Tinnitus, bilateral: Secondary | ICD-10-CM

## 2020-09-07 DIAGNOSIS — M5442 Lumbago with sciatica, left side: Secondary | ICD-10-CM | POA: Diagnosis not present

## 2020-09-07 DIAGNOSIS — H903 Sensorineural hearing loss, bilateral: Secondary | ICD-10-CM

## 2020-09-07 NOTE — Procedures (Signed)
  Outpatient Audiology and [facility] [address] [place], [place]  [address] [phone]  AUDIOLOGICAL  EVALUATION  NAME: Thomas Moody     DOB:   03-10-1955      MRN: [id number]                                                                                     DATE: 09/07/2020     REFERENT: Rema Fendt, NP STATUS: Outpatient DIAGNOSIS: Sensorineural Hearing Loss, bilateral    History: Thomas Moody was seen for an audiological evaluation. Thomas Moody reports some concerns regarding his hearing sensitivity and he often turns the television volume up. 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 He reports intermittent tinnitus and aural fullness. He reports a history of severe vertigo. He denies otalgia.   Evaluation:  Otoscopy showed a clear view of the tympanic membranes, bilaterally Tympanometry results were consistent with normal middle ear pressure and normal tympanic membrane mobility.  Audiometric testing was completed using Conventional Audiometry techniques with insert earphones and TDH headphones. Test results are consistent with Normal hearing sensitivity in the left ear with the exception of a mild hearing loss at 2000 Hz and 8000 Hz and normal hearing sensitivity in the right ear with the exception of a mild sensorineural hearing loss at 2000-3000 Hz and 8000 Hz. Slight asymmetry noted at 3000-4000 Hz, worse in the right ear. Speech Recognition Thresholds were obtained at 20 dB HL in the right ear and at 10  dB HL in the left ear. Word Recognition Testing was completed at 70 dB HL and Thomas Moody scored 100%, bilaterally.    Results:  The test results were reviewed with Thomas Moody. Today's test results are consistent with Normal hearing sensitivity in the left ear with the exception of a mild hearing loss at 2000 Hz and 8000 Hz and normal hearing sensitivity in the right ear with the exception of a mild sensorineural hearing  loss at 2000-3000 Hz and 8000 Hz. Slight asymmetry noted at 3000-4000 Hz, worse in the right ear. Thomas Moody may have listening difficulty in adverse listening environments. He will benefit from the use of good communication strategies.   Recommendations: 1.   Return in 2-3 years for a repeat audiological evaluation to continue to monitor hearing sensitivity.     If you have any questions please feel free to contact me at [phone].  Marton Redwood Audiologist, Au.D., CCC-A 09/07/2020  11:26 AM  Cc: Rema Fendt, NP

## 2020-10-18 ENCOUNTER — Other Ambulatory Visit: Payer: Self-pay | Admitting: Family

## 2020-10-18 DIAGNOSIS — G8929 Other chronic pain: Secondary | ICD-10-CM

## 2020-10-18 DIAGNOSIS — M5442 Lumbago with sciatica, left side: Secondary | ICD-10-CM

## 2020-11-17 ENCOUNTER — Other Ambulatory Visit: Payer: Self-pay | Admitting: *Deleted

## 2020-11-17 DIAGNOSIS — E785 Hyperlipidemia, unspecified: Secondary | ICD-10-CM

## 2020-11-17 MED ORDER — ATORVASTATIN CALCIUM 40 MG PO TABS: 40.0000 mg | ORAL_TABLET | Freq: Every day | ORAL | 0 refills | Status: DC

## 2020-12-13 ENCOUNTER — Other Ambulatory Visit (HOSPITAL_BASED_OUTPATIENT_CLINIC_OR_DEPARTMENT_OTHER): Payer: Self-pay

## 2020-12-13 ENCOUNTER — Ambulatory Visit: Payer: TRICARE For Life (TFL) | Attending: Internal Medicine

## 2020-12-13 DIAGNOSIS — Z23 Encounter for immunization: Secondary | ICD-10-CM

## 2020-12-13 MED ORDER — PFIZER COVID-19 VAC BIVALENT 30 MCG/0.3ML IM SUSP
INTRAMUSCULAR | 0 refills | Status: DC
  Filled 2020-12-13: qty 0.3, 1d supply, fill #0

## 2020-12-13 NOTE — Progress Notes (Signed)
   Covid-19 Vaccination Clinic  Name:  Thomas Moody    MRN: [id number] DOB: Jun 09, 1955  12/13/2020  [name] was observed post Covid-19 immunization for 15 minutes without incident. He was provided with Vaccine Information Sheet and instruction to access the V-Safe system.   [name] was instructed to call 911 with any severe reactions post vaccine: Difficulty breathing  Swelling of face and throat  A fast heartbeat  A bad rash all over body  Dizziness and weakness   Immunizations Administered     Name Date Dose VIS Date Route   Pfizer Covid-19 Vaccine Bivalent Booster 12/13/2020 10:43 AM 0.3 mL 10/11/2020 Intramuscular   Manufacturer: [employer], [employer]   Lot: [id number]   NDC: [phone]

## 2021-01-22 NOTE — Progress Notes (Signed)
Patient ID: Thomas Moody, male    DOB: January 27, 1956  MRN: [id number]  CC: Pre-diabetes Follow-Up   Subjective: Thomas Moody is a 65 y.o. male who presents for pre-diabetes follow-up.   His concerns today include:  Doing well on Atorvastatin, no issues/concerns.  Patient Active Problem List   Diagnosis Date Noted   Prediabetes 07/29/2020   Hyperlipidemia 07/29/2020   Osteoarthritis of spine with radiculopathy, cervical region 03/22/2016     Current Outpatient Medications on File Prior to Visit  Medication Sig Dispense Refill   aspirin 81 MG tablet Take 81 mg by mouth daily.     CINNAMON PO Take 1 tablet by mouth daily.     COVID-19 mRNA bivalent vaccine, Pfizer, (PFIZER COVID-19 VAC BIVALENT) injection Inject into the muscle. 0.3 mL 0   meclizine (ANTIVERT) 12.5 MG tablet Take 1 tablet (12.5 mg total) by mouth 3 (three) times daily as needed for dizziness. 30 tablet 0   meloxicam (MOBIC) 7.5 MG tablet TAKE 1 TABLET(7.5 MG) BY MOUTH DAILY 30 tablet 1   ondansetron (ZOFRAN ODT) 4 MG disintegrating tablet Take 1 tablet (4 mg total) by mouth every 8 (eight) hours as needed for nausea or vomiting. 20 tablet 0   triamcinolone cream (KENALOG) 0.1 % Apply 1 application topically 2 (two) times daily. 80 g 0   No current facility-administered medications on file prior to visit.    No Known Allergies  Social History   Socioeconomic History   Marital status: Married    Spouse name: Not on file   Number of children: Not on file   Years of education: Not on file   Highest education level: Not on file  Occupational History   Not on file  Tobacco Use   Smoking status: Never   Smokeless tobacco: Never  Vaping Use   Vaping Use: Never used  Substance and Sexual Activity   Alcohol use: Not Currently    Alcohol/week: 0.0 standard drinks    Comment: occ   Drug use: No   Sexual activity: Yes  Other Topics Concern   Not on file  Social History Narrative   Married; 3 sons    Social Determinants of Health   Financial Resource Strain: Not on file  Food Insecurity: Not on file  Transportation Needs: Not on file  Physical Activity: Not on file  Stress: Not on file  Social Connections: Not on file  Intimate Partner Violence: Not on file    Family History  Problem Relation Age of Onset   Heart disease Mother    Dementia Father    Hypertension Sister    Diabetes Son    Diabetes type I Son    Diabetes Paternal Grandmother    Diabetes Son    Diabetes type II Son    Autism Son        middle son    Past Surgical History:  Procedure Laterality Date   PILONIDAL CYST EXCISION      ROS: Review of Systems Negative except as stated above  PHYSICAL EXAM: BP 131/77 (BP Location: Left Arm, Patient Position: Sitting, Cuff Size: Large)   Pulse 78   Temp 98.3 F (36.8 C)   Resp 18   Ht 5' 11.77" (1.823 m)   Wt 225 lb (102.1 kg)   SpO2 97%   BMI 30.71 kg/m   Physical Exam HENT:     Head: Normocephalic and atraumatic.  Eyes:     Extraocular Movements: Extraocular movements intact.  Conjunctiva/sclera: Conjunctivae normal.     Pupils: Pupils are equal, round, and reactive to light.  Cardiovascular:     Rate and Rhythm: Normal rate and regular rhythm.     Pulses: Normal pulses.     Heart sounds: Normal heart sounds.  Pulmonary:     Effort: Pulmonary effort is normal.     Breath sounds: Normal breath sounds.  Musculoskeletal:     Cervical back: Normal range of motion and neck supple.  Neurological:     General: No focal deficit present.     Mental Status: He is alert and oriented to person, place, and time.  Psychiatric:        Mood and Affect: Mood normal.        Behavior: Behavior normal.   Results for orders placed or performed in visit on 01/24/21  POCT glycosylated hemoglobin (Hb A1C)  Result Value Ref Range   Hemoglobin A1C 6.0 (A) 4.0 - 5.6 %   HbA1c POC (<> result, manual entry)     HbA1c, POC (prediabetic range)     HbA1c,  POC (controlled diabetic range)      ASSESSMENT AND PLAN: 1. Prediabetes: - Hemoglobin A1c today 6.0% and slightly improved since previous 6.2% on 07/28/2020. - Follow-up with primary provider in 6 months or sooner if needed.  - POCT glycosylated hemoglobin (Hb A1C)  2. Hyperlipidemia, unspecified hyperlipidemia type: - Practice low-fat heart healthy diet and at least 150 minutes of moderate intensity exercise weekly as tolerated.  - Continue Atorvastatin as prescribed.  - Update lipid panel today.  - Follow-up with primary provider as scheduled.  - Lipid Panel - atorvastatin (LIPITOR) 40 MG tablet; Take 1 tablet (40 mg total) by mouth daily.  Dispense: 90 tablet; Refill: 1  3. Need for pneumococcal vaccination: - Administered today in office.  - Pneumococcal polysaccharide vaccine 23-valent greater than or equal to 2yo subcutaneous/IM  4. Need for immunization against influenza: - Administered today in office.  - Flu Vaccine QUAD 11mo+IM (Fluarix, Fluzone & Alfiuria Quad PF)    Patient was given the opportunity to ask questions.  Patient verbalized understanding of the plan and was able to repeat key elements of the plan. Patient was given clear instructions to go to Emergency Department or return to medical center if symptoms don't improve, worsen, or new problems develop.The patient verbalized understanding.   Orders Placed This Encounter  Procedures   Pneumococcal polysaccharide vaccine 23-valent greater than or equal to 2yo subcutaneous/IM   Flu Vaccine QUAD 96mo+IM (Fluarix, Fluzone & Alfiuria Quad PF)   Lipid Panel   POCT glycosylated hemoglobin (Hb A1C)     Requested Prescriptions   Signed Prescriptions Disp Refills   atorvastatin (LIPITOR) 40 MG tablet 90 tablet 1    Sig: Take 1 tablet (40 mg total) by mouth daily.    Return in 6 months (on 07/25/2021) for Follow-Up or next available prediabetes .  Rema Fendt, NP

## 2021-01-24 ENCOUNTER — Ambulatory Visit (INDEPENDENT_AMBULATORY_CARE_PROVIDER_SITE_OTHER): Payer: Medicare Other | Admitting: Family

## 2021-01-24 ENCOUNTER — Encounter: Payer: Self-pay | Admitting: Family

## 2021-01-24 ENCOUNTER — Other Ambulatory Visit: Payer: Self-pay

## 2021-01-24 VITALS — BP 131/77 | HR 78 | Temp 98.3°F | Resp 18 | Ht 71.77 in | Wt 225.0 lb

## 2021-01-24 DIAGNOSIS — Z23 Encounter for immunization: Secondary | ICD-10-CM | POA: Diagnosis not present

## 2021-01-24 DIAGNOSIS — E785 Hyperlipidemia, unspecified: Secondary | ICD-10-CM

## 2021-01-24 DIAGNOSIS — R7303 Prediabetes: Secondary | ICD-10-CM | POA: Diagnosis not present

## 2021-01-24 LAB — POCT GLYCOSYLATED HEMOGLOBIN (HGB A1C): Hemoglobin A1C: 6 % — AB (ref 4.0–5.6)

## 2021-01-24 MED ORDER — ATORVASTATIN CALCIUM 40 MG PO TABS: 40.0000 mg | ORAL_TABLET | Freq: Every day | ORAL | 1 refills | Status: DC

## 2021-01-24 NOTE — Progress Notes (Signed)
Pt presents for prediabetes follow-up, needs refill on atorvastatin   Pneumovax and Flu vaccine administered

## 2021-01-24 NOTE — Progress Notes (Signed)
Diabetes discussed in office.

## 2021-01-25 LAB — LIPID PANEL
Chol/HDL Ratio: 2.9 ratio (ref 0.0–5.0)
Cholesterol, Total: 150 mg/dL (ref 100–199)
HDL: 52 mg/dL (ref >39)
LDL Chol Calc (NIH): 85 mg/dL (ref 0–99)
Triglycerides: 67 mg/dL (ref 0–149)
VLDL Cholesterol Cal: 13 mg/dL (ref 5–40)

## 2021-01-25 NOTE — Progress Notes (Signed)
Cholesterol improved since 6 months ago. Continue Atorvastatin for cholesterol maintenance.

## 2021-02-21 DIAGNOSIS — M2550 Pain in unspecified joint: Secondary | ICD-10-CM | POA: Insufficient documentation

## 2021-03-22 ENCOUNTER — Other Ambulatory Visit: Payer: Self-pay | Admitting: Family Medicine

## 2021-03-22 DIAGNOSIS — G8929 Other chronic pain: Secondary | ICD-10-CM

## 2021-03-22 NOTE — Telephone Encounter (Signed)
Meloxicam refilled as requested. Schedule appointment for additional refills.

## 2021-06-04 IMAGING — DX DG SHOULDER 2+V*R*
4 series · 4 of 4 positions shown · non-contrast
Comparison: None.

CLINICAL DATA: Decreased range of motion after fall this morning.

EXAM:
RIGHT SHOULDER - 2+ VIEW

[shoulder neutral ap (1 of 2)]
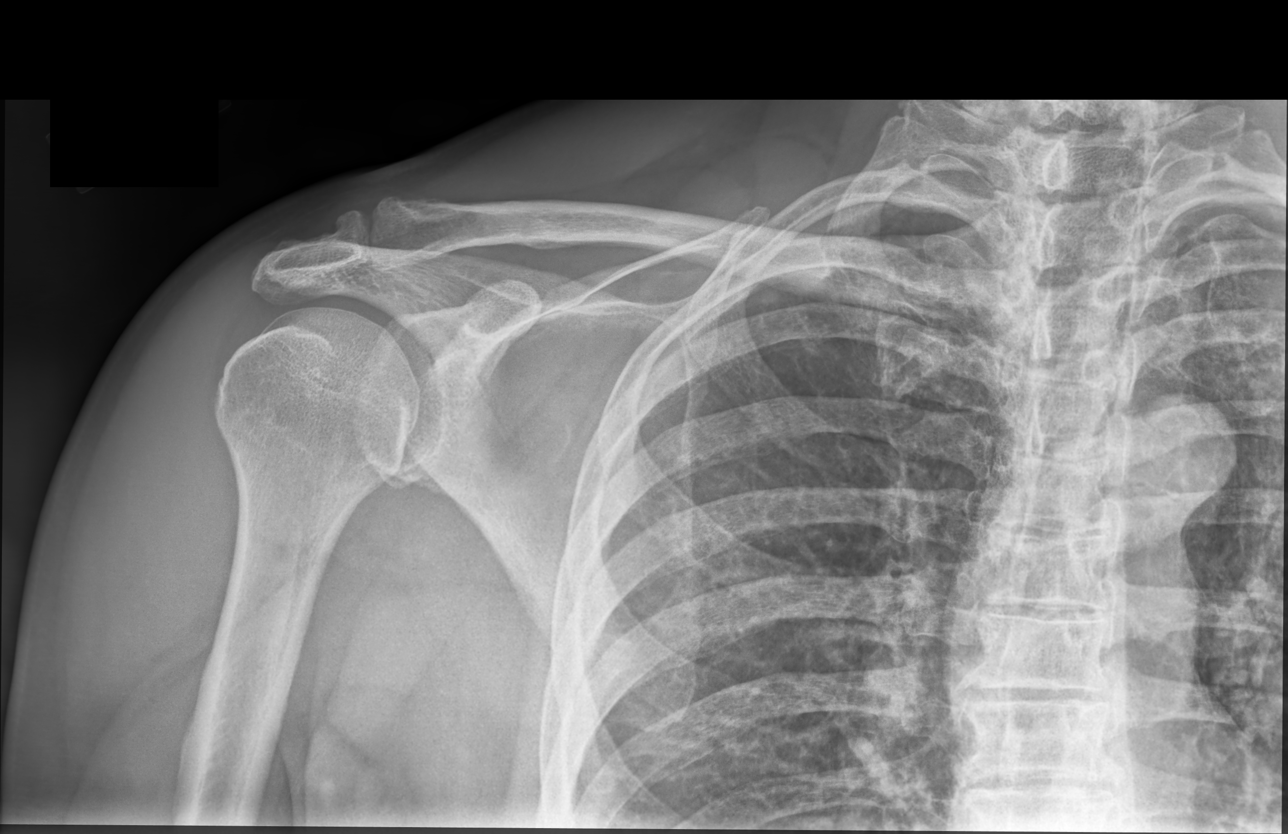

[shoulder neutral ap (2 of 2)]
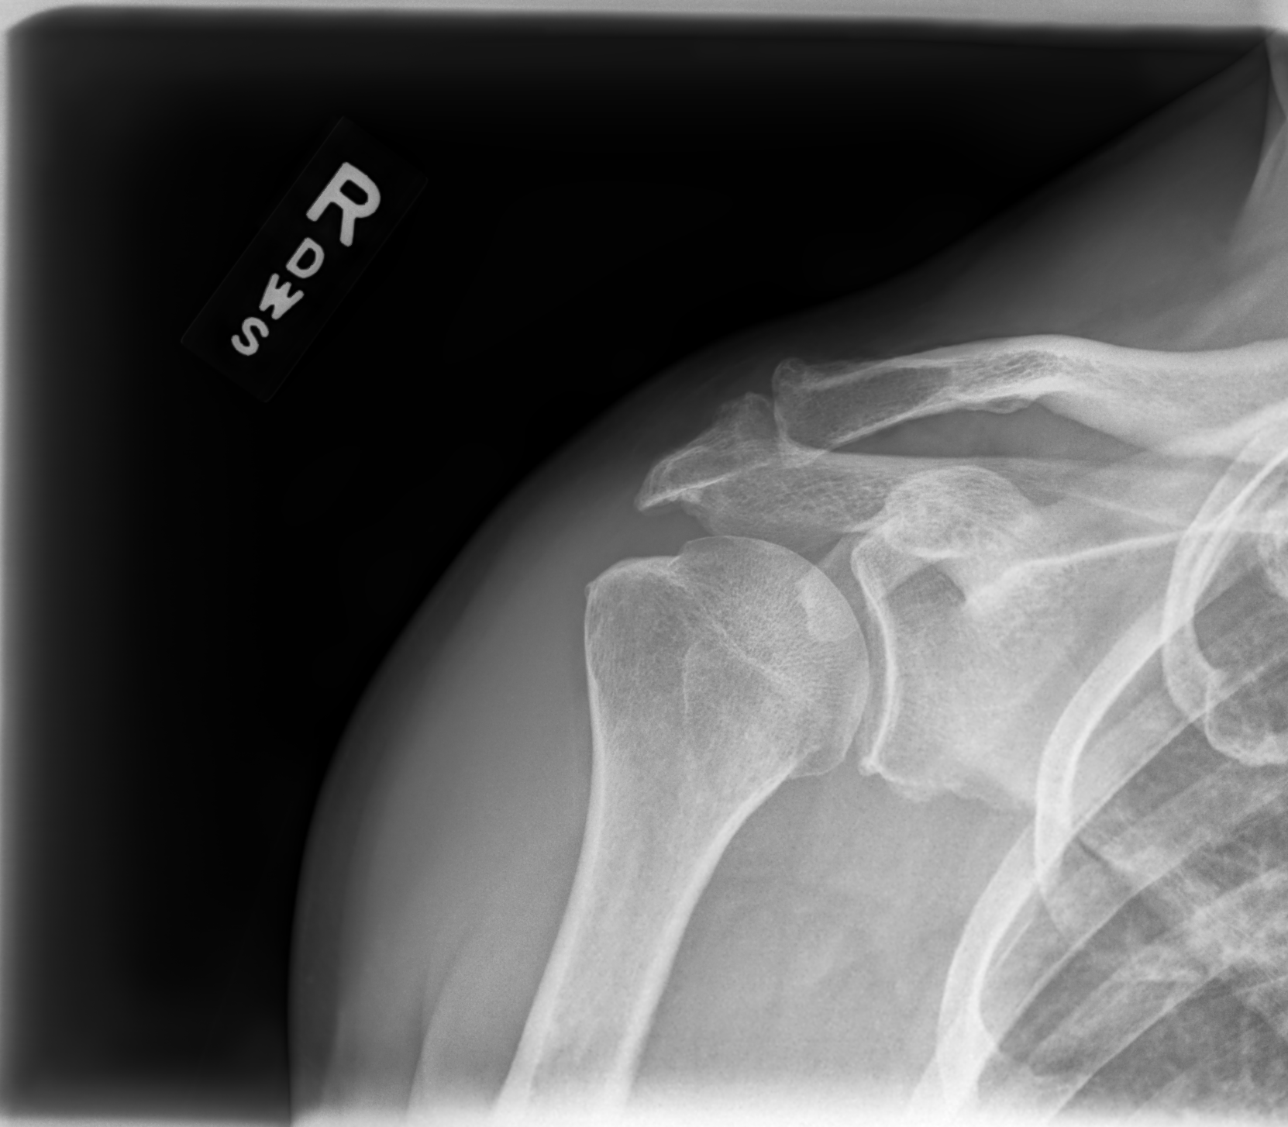

[shoulder transscapular y view (neer)]
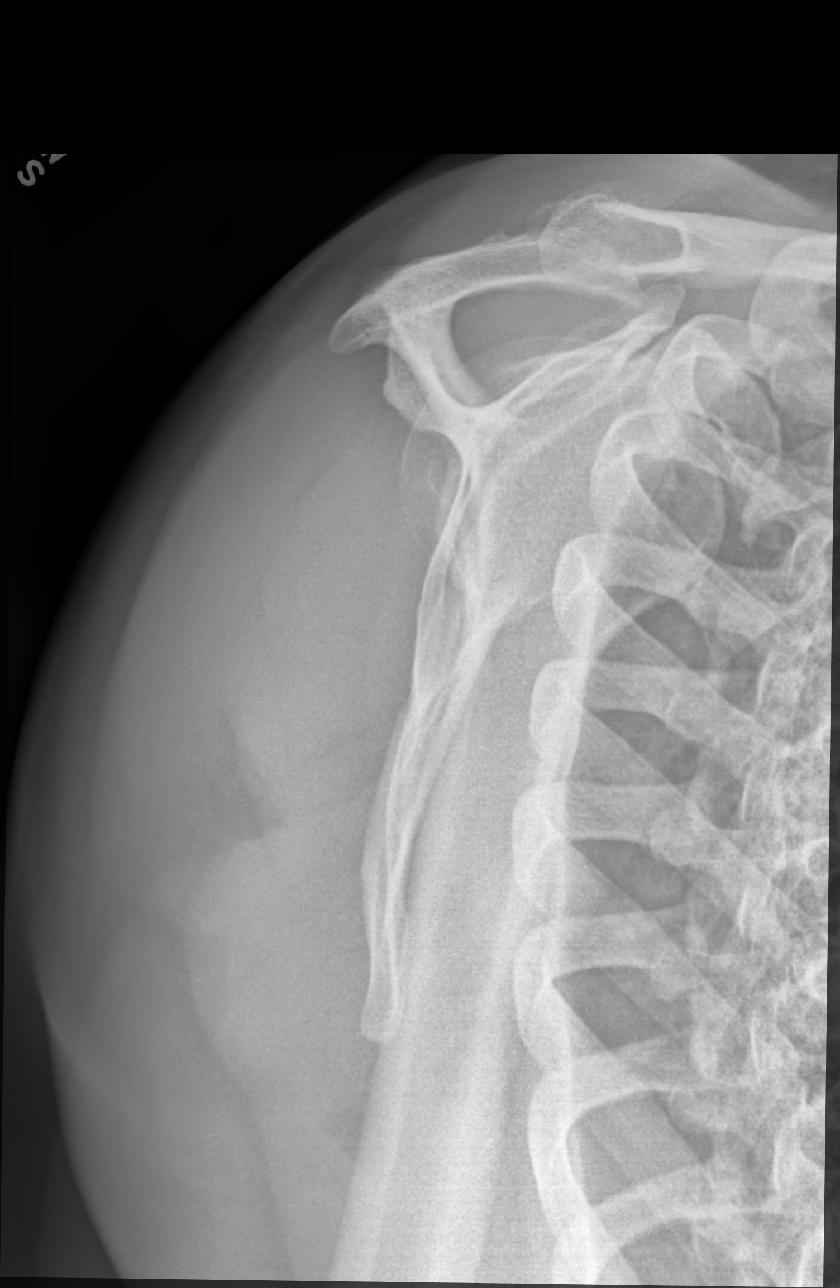

[shoulder axial 45° seated]
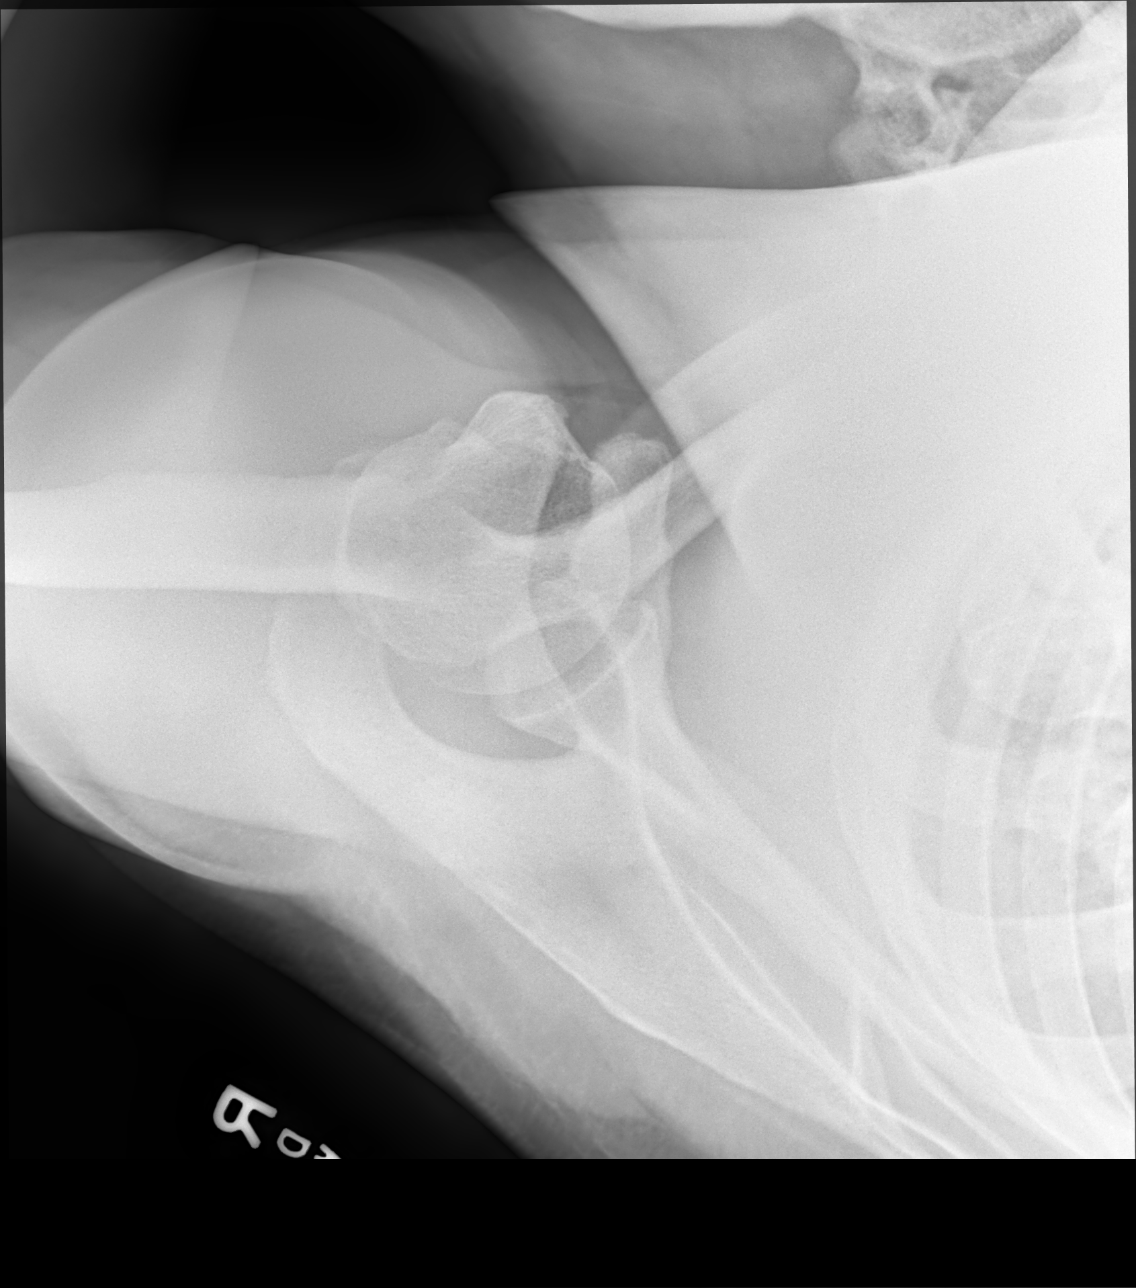

[4 of 4 positions shown; findings below may reference images not displayed]

FINDINGS: Mild glenohumeral degenerative changes are noted. No fractures are
seen. Limited views of the right chest are normal. No shoulder
dislocation identified. AC joint degenerative changes noted.
IMPRESSION: No fracture or dislocation. Glenohumeral and AC joint degenerative
changes.

## 2021-07-06 NOTE — Progress Notes (Unsigned)
Patient ID: Thomas Moody, male    DOB: September 19, 1955  MRN: [id number]  CC: Prediabetes Follow-Up  Subjective: Thomas Moody is a 66 y.o. male who presents for prediabetes follow-up.   His concerns today include: ***  Patient Active Problem List   Diagnosis Date Noted   Prediabetes 07/29/2020   Hyperlipidemia 07/29/2020   Osteoarthritis of spine with radiculopathy, cervical region 03/22/2016     Current Outpatient Medications on File Prior to Visit  Medication Sig Dispense Refill   aspirin 81 MG tablet Take 81 mg by mouth daily.     atorvastatin (LIPITOR) 40 MG tablet Take 1 tablet (40 mg total) by mouth daily. 90 tablet 1   CINNAMON PO Take 1 tablet by mouth daily.     COVID-19 mRNA bivalent vaccine, Pfizer, (PFIZER COVID-19 VAC BIVALENT) injection Inject into the muscle. 0.3 mL 0   meclizine (ANTIVERT) 12.5 MG tablet Take 1 tablet (12.5 mg total) by mouth 3 (three) times daily as needed for dizziness. 30 tablet 0   meloxicam (MOBIC) 7.5 MG tablet TAKE 1 TABLET(7.5 MG) BY MOUTH DAILY 30 tablet 2   ondansetron (ZOFRAN ODT) 4 MG disintegrating tablet Take 1 tablet (4 mg total) by mouth every 8 (eight) hours as needed for nausea or vomiting. 20 tablet 0   triamcinolone cream (KENALOG) 0.1 % Apply 1 application topically 2 (two) times daily. 80 g 0   No current facility-administered medications on file prior to visit.    No Known Allergies  Social History   Socioeconomic History   Marital status: Married    Spouse name: Not on file   Number of children: Not on file   Years of education: Not on file   Highest education level: Not on file  Occupational History   Not on file  Tobacco Use   Smoking status: Never   Smokeless tobacco: Never  Vaping Use   Vaping Use: Never used  Substance and Sexual Activity   Alcohol use: Not Currently    Alcohol/week: 0.0 standard drinks    Comment: occ   Drug use: No   Sexual activity: Yes  Other Topics Concern   Not on file  Social  History Narrative   Married; 3 sons   Social Determinants of Health   Financial Resource Strain: Not on file  Food Insecurity: Not on file  Transportation Needs: Not on file  Physical Activity: Not on file  Stress: Not on file  Social Connections: Not on file  Intimate Partner Violence: Not on file    Family History  Problem Relation Age of Onset   Heart disease Mother    Dementia Father    Hypertension Sister    Diabetes Son    Diabetes type I Son    Diabetes Paternal Grandmother    Diabetes Son    Diabetes type II Son    Autism Son        middle son    Past Surgical History:  Procedure Laterality Date   PILONIDAL CYST EXCISION      ROS: Review of Systems Negative except as stated above  PHYSICAL EXAM: There were no vitals taken for this visit.  Physical Exam  {male adult master:310786} {male adult master:310785}     Latest Ref Rng & Units 07/28/2020    4:21 PM 08/31/2018   11:08 AM 03/04/2018   10:05 AM  CMP  Glucose 65 - 99 mg/dL 81   387   91    BUN 8 -  27 mg/dL 12   9   10     Creatinine 0.76 - 1.27 mg/dL   7.62   2.63    Sodium 134 - 144 mmol/L 141   140   143    Potassium 3.5 - 5.2 mmol/L 3.8   4.3   4.0    Chloride 96 - 106 mmol/L 108   106   106    CO2 20 - 29 mmol/L 20   22   20     Calcium 8.6 - 10.2 mg/dL 9.4   9.2   9.2    Total Protein 6.0 - 8.5 g/dL 7.3    6.9    Total Bilirubin 0.0 - 1.2 mg/dL 0.6    0.5    Alkaline Phos 44 - 121 IU/L 67    54    AST 0 - 40 IU/L 16    15    ALT 0 - 44 IU/L 28    25     Lipid Panel     Component Value Date/Time   CHOL 150 01/24/2021 1057   TRIG 67 01/24/2021 1057   HDL 52 01/24/2021 1057   CHOLHDL 2.9 01/24/2021 1057   CHOLHDL 3.2 04/08/2014 1211   VLDL 13 04/08/2014 1211   LDLCALC 85 01/24/2021 1057    CBC    Component Value Date/Time   WBC 3.2 (L) 07/28/2020 1621   WBC 3.4 (L) 04/08/2014 1211   RBC 5.13 07/28/2020 1621   RBC 5.34 04/08/2014 1211   HGB 15.3 07/28/2020 1621   HCT  44.8 07/28/2020 1621   PLT 260 07/28/2020 1621   MCV 87 07/28/2020 1621   MCH 29.8 07/28/2020 1621   MCH 29.6 04/08/2014 1211   MCHC 34.2 07/28/2020 1621   MCHC 34.3 04/08/2014 1211   RDW 14.8 07/28/2020 1621   LYMPHSABS 0.7 08/31/2018 1040   MONOABS 0.4 04/08/2014 1211   EOSABS 0.2 08/31/2018 1040   BASOSABS 0.1 08/31/2018 1040    ASSESSMENT AND PLAN:  There are no diagnoses linked to this encounter.   Patient was given the opportunity to ask questions.  Patient verbalized understanding of the plan and was able to repeat key elements of the plan. Patient was given clear instructions to go to Emergency Department or return to medical center if symptoms don't improve, worsen, or new problems develop.The patient verbalized understanding.   No orders of the defined types were placed in this encounter.    Requested Prescriptions    No prescriptions requested or ordered in this encounter    No follow-ups on file.  09/02/2018, NP

## 2021-07-12 ENCOUNTER — Encounter: Payer: Self-pay | Admitting: Family

## 2021-07-12 ENCOUNTER — Ambulatory Visit (INDEPENDENT_AMBULATORY_CARE_PROVIDER_SITE_OTHER): Payer: Medicare PPO | Admitting: Family

## 2021-07-12 VITALS — BP 112/73 | HR 74 | Temp 98.3°F | Resp 18 | Ht 71.77 in | Wt 225.0 lb

## 2021-07-12 DIAGNOSIS — R7303 Prediabetes: Secondary | ICD-10-CM | POA: Diagnosis not present

## 2021-07-12 DIAGNOSIS — M79674 Pain in right toe(s): Secondary | ICD-10-CM

## 2021-07-12 DIAGNOSIS — G8929 Other chronic pain: Secondary | ICD-10-CM

## 2021-07-12 DIAGNOSIS — M25552 Pain in left hip: Secondary | ICD-10-CM

## 2021-07-12 DIAGNOSIS — E785 Hyperlipidemia, unspecified: Secondary | ICD-10-CM

## 2021-07-12 LAB — POCT GLYCOSYLATED HEMOGLOBIN (HGB A1C): Hemoglobin A1C: 6.1 % — AB (ref 4.0–5.6)

## 2021-07-12 MED ORDER — ATORVASTATIN CALCIUM 40 MG PO TABS: 40.0000 mg | ORAL_TABLET | Freq: Every day | ORAL | 1 refills | Status: DC

## 2021-07-12 NOTE — Progress Notes (Signed)
Prediabetes discussed in office.

## 2021-07-12 NOTE — Progress Notes (Signed)
Pt presents for prediabetes f/u  Pain in left hip and right big toe been going on for about 2 months  Pt states that he had to use his Zofran and meclizine this morning for dizziness and nausea

## 2021-07-31 ENCOUNTER — Ambulatory Visit (INDEPENDENT_AMBULATORY_CARE_PROVIDER_SITE_OTHER): Payer: Medicare PPO

## 2021-07-31 ENCOUNTER — Encounter: Payer: Self-pay | Admitting: Orthopaedic Surgery

## 2021-07-31 ENCOUNTER — Ambulatory Visit (INDEPENDENT_AMBULATORY_CARE_PROVIDER_SITE_OTHER): Payer: Medicare PPO | Admitting: Orthopaedic Surgery

## 2021-07-31 DIAGNOSIS — M545 Low back pain, unspecified: Secondary | ICD-10-CM | POA: Diagnosis not present

## 2021-07-31 DIAGNOSIS — G8929 Other chronic pain: Secondary | ICD-10-CM

## 2021-07-31 DIAGNOSIS — M2021 Hallux rigidus, right foot: Secondary | ICD-10-CM

## 2021-07-31 DIAGNOSIS — M79674 Pain in right toe(s): Secondary | ICD-10-CM | POA: Insufficient documentation

## 2021-07-31 NOTE — Progress Notes (Signed)
Office Visit Note   Patient: Thomas Moody           Date of Birth: [birth year]           MRN: [id number] Visit Date: 07/31/2021              Requested by: Rema Fendt, NP [address] [place],  [place] [address] PCP: Rema Fendt, NP   Assessment & Plan: Visit Diagnoses:  1. Hallux rigidus of right foot   2. Chronic midline low back pain, unspecified whether sciatica present     Plan: Impression is end-stage right hallux rigidus and chronic left sided low back pain.  For the great toe treatment options were explained and he is not interested in wearing supportive shoes or stiff orthotics.  Declined cortisone injection.  Has tried over-the-counter NSAIDs without significant relief.  Based on his options he would like to pursue a fusion of the right great toe.  Details of the surgery including risk benefits prognosis reviewed in detail.  In regards to the low back pain we will obtain MRI to rule out structural abnormalities.  Follow-Up Instructions: No follow-ups on file.   Orders:  Orders Placed This Encounter  Procedures   XR Toe Great Right   MR Lumbar Spine w/o contrast   No orders of the defined types were placed in this encounter.     Procedures: No procedures performed   Clinical Data: No additional findings.   Subjective: Chief Complaint  Patient presents with   Left Hip - Pain   Right Foot - Pain    HPI Thomas Moody is a very pleasant 66 year old gentleman here for evaluation of 2 problems.  First problem is chronic right great toe pain for a year.  Denies any injuries.  He has pain with all ADLs and with kneeling down or hyperextension.  Denies any swelling.  Limps as a result.  Second problem is left hip and lateral low back pain.  Has seen Dr. Jorge Ny in the past and was sent to PT which helped some.  Pain improved but then has recently gotten worse.  Sitting helps the pain.  Meloxicam gives temporary relief.  Denies any red flag  symptoms.  Review of Systems  Constitutional: Negative.   All other systems reviewed and are negative.    Objective: Vital Signs: There were no vitals taken for this visit.  Physical Exam Vitals and nursing note reviewed.  Constitutional:      Appearance: He is well-developed.  Pulmonary:     Effort: Pulmonary effort is normal.  Abdominal:     Palpations: Abdomen is soft.  Skin:    General: Skin is warm.  Neurological:     Mental Status: He is alert and oriented to person, place, and time.  Psychiatric:        Behavior: Behavior normal.        Thought Content: Thought content normal.        Judgment: Judgment normal.     Ortho Exam Examination of the right great toe shows pain and crepitus with grind test.  There is no hallux valgus deformity.  Palpable dorsal osteophyte.  Moderate restriction in range of motion of the MTP joint.  Examination of the lumbar spine and left lower extremity is nonfocal.  Nontender to the lateral hip.  No groin pain.  No sciatic tension signs. Specialty Comments:  No specialty comments available.  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    PMFS History: Patient Active Problem List   Diagnosis Date Noted   Chronic midline low back pain 07/31/2021   Great toe pain, right 07/31/2021   Arthritic-like pain 02/21/2021   Prediabetes 07/29/2020   Hyperlipidemia 07/29/2020   Osteoarthritis of spine with radiculopathy, cervical region 03/22/2016   Past Medical History:  Diagnosis Date   Asthma    GERD (gastroesophageal reflux disease)     Family History  Problem Relation Age of Onset   Heart disease Mother    Dementia Father    Hypertension Sister    Diabetes Son    Diabetes type I Son    Diabetes Paternal Grandmother    Diabetes Son    Diabetes type II Son    Autism Son        middle son    Past Surgical  History:  Procedure Laterality Date   PILONIDAL CYST EXCISION     Social History   Occupational History   Not on file  Tobacco Use   Smoking status: Never    Passive exposure: Never   Smokeless tobacco: Never  Vaping Use   Vaping Use: Never used  Substance and Sexual Activity   Alcohol use: Not Currently    Alcohol/week: 0.0 standard drinks of alcohol    Comment: occ   Drug use: No   Sexual activity: Yes

## 2021-08-13 ENCOUNTER — Ambulatory Visit
Admission: RE | Admit: 2021-08-13 | Discharge: 2021-08-13 | Disposition: A | Discharge status: Home / Self Care | Payer: Medicare PPO | Source: Ambulatory Visit | Attending: Orthopaedic Surgery | Admitting: Orthopaedic Surgery

## 2021-08-13 DIAGNOSIS — M545 Low back pain, unspecified: Secondary | ICD-10-CM

## 2021-08-15 ENCOUNTER — Other Ambulatory Visit: Payer: Medicare PPO

## 2021-08-21 ENCOUNTER — Ambulatory Visit (INDEPENDENT_AMBULATORY_CARE_PROVIDER_SITE_OTHER): Payer: Medicare PPO | Admitting: Orthopaedic Surgery

## 2021-08-21 ENCOUNTER — Encounter: Payer: Self-pay | Admitting: Orthopaedic Surgery

## 2021-08-21 DIAGNOSIS — M545 Low back pain, unspecified: Secondary | ICD-10-CM | POA: Diagnosis not present

## 2021-08-21 DIAGNOSIS — G8929 Other chronic pain: Secondary | ICD-10-CM | POA: Diagnosis not present

## 2021-08-21 MED ORDER — PREDNISONE 10 MG (21) PO TBPK: ORAL_TABLET | ORAL | 3 refills | Status: AC

## 2021-08-21 NOTE — Progress Notes (Signed)
   Office Visit Note   Patient: Thomas Moody           Date of Birth: 08-01-1955           MRN: [id number] Visit Date: 08/21/2021              Requested by: Rema Fendt, NP [address] [place],  [place] [address] PCP: Rema Fendt, NP   Assessment & Plan: Visit Diagnoses:  1. Chronic midline low back pain, unspecified whether sciatica present     Plan: MRI of the lumbar spine shows bulging disc at L3-4 with left L3 radiculopathy as well as severe facet arthrosis at L4-5.  These findings were reviewed and treatment options explained.  Based on these options he would like to try prednisone Dosepak first before going with ESI.  He will call if he feels better and wants to do physical therapy.  Follow-Up Instructions: No follow-ups on file.   Orders:  No orders of the defined types were placed in this encounter.  Meds ordered this encounter  Medications   predniSONE (STERAPRED UNI-PAK 21 TAB) 10 MG (21) TBPK tablet    Sig: Take as directed    Dispense:  21 tablet    Refill:  3      Procedures: No procedures performed   Clinical Data: No additional findings.   Subjective: Chief Complaint  Patient presents with   Lower Back - Follow-up    MRI review    HPI Patient is here to review lumbar spine MRI. Review of Systems   Objective: Vital Signs: There were no vitals taken for this visit.  Physical Exam  Ortho Exam Examination lumbar spine is unchanged. Specialty Comments:  No specialty comments available.  Imaging: No results found.   PMFS History: Patient Active Problem List   Diagnosis Date Noted   Chronic midline low back pain 07/31/2021   Great toe pain, right 07/31/2021   Arthritic-like pain 02/21/2021   Prediabetes 07/29/2020   Hyperlipidemia 07/29/2020   Osteoarthritis of spine with radiculopathy, cervical region 03/22/2016   Past Medical History:  Diagnosis Date   Asthma    GERD (gastroesophageal reflux disease)      Family History  Problem Relation Age of Onset   Heart disease Mother    Dementia Father    Hypertension Sister    Diabetes Son    Diabetes type I Son    Diabetes Paternal Grandmother    Diabetes Son    Diabetes type II Son    Autism Son        middle son    Past Surgical History:  Procedure Laterality Date   PILONIDAL CYST EXCISION     Social History   Occupational History   Not on file  Tobacco Use   Smoking status: Never    Passive exposure: Never   Smokeless tobacco: Never  Vaping Use   Vaping Use: Never used  Substance and Sexual Activity   Alcohol use: Not Currently    Alcohol/week: 0.0 standard drinks of alcohol    Comment: occ   Drug use: No   Sexual activity: Yes

## 2021-09-02 ENCOUNTER — Other Ambulatory Visit: Payer: Self-pay | Admitting: Family

## 2021-09-02 DIAGNOSIS — G8929 Other chronic pain: Secondary | ICD-10-CM

## 2021-09-09 ENCOUNTER — Other Ambulatory Visit: Payer: Self-pay | Admitting: Physician Assistant

## 2021-09-09 MED ORDER — OXYCODONE-ACETAMINOPHEN 5-325 MG PO TABS: 1.0000 | ORAL_TABLET | Freq: Three times a day (TID) | ORAL | 0 refills | Status: DC | PRN

## 2021-09-09 MED ORDER — ONDANSETRON HCL 4 MG PO TABS: 4.0000 mg | ORAL_TABLET | Freq: Three times a day (TID) | ORAL | 0 refills | Status: DC | PRN

## 2021-09-13 ENCOUNTER — Encounter: Payer: Self-pay | Admitting: Orthopaedic Surgery

## 2021-09-13 DIAGNOSIS — G8918 Other acute postprocedural pain: Secondary | ICD-10-CM | POA: Diagnosis not present

## 2021-09-13 DIAGNOSIS — M2021 Hallux rigidus, right foot: Secondary | ICD-10-CM | POA: Diagnosis not present

## 2021-09-20 NOTE — Progress Notes (Signed)
   Post-Op Visit Note   Patient: Thomas Moody           Date of Birth: [birth year]           MRN: [id number] Visit Date: 09/21/2021 PCP: Rema Fendt, NP   Assessment & Plan:  Chief Complaint:  Chief Complaint  Patient presents with   Right Great Toe - Routine Post Op   Visit Diagnoses:  1. Hallux rigidus of right foot     Plan: Thomas Moody is 1 week status post right great toe MTP fusion on 09/13/2021.  No real complaints other than swelling and inflammation.  Examination of the right foot shows intact surgical incision.  No signs of infection or drainage.  Expected postoperative erythematous dependent edema.  Neurovascular intact.  X-rays show stable fixation across the MTP joint.  Thomas Moody is doing well overall.  Dry bandage was placed today.  Continue cam boot when weightbearing.  He should try to keep it elevated more often for the swelling.  Follow-up next week to see Mardella Layman for suture removal.  He can go into a postop shoe at his visit with Mardella Layman.  Follow-Up Instructions: Return for follow up with Mardella Layman next wed or thurs for suture removal.   Orders:  Orders Placed This Encounter  Procedures   XR Toe Great Right   No orders of the defined types were placed in this encounter.   Imaging: No results found.  PMFS History: Patient Active Problem List   Diagnosis Date Noted   Hallux rigidus, right foot 09/13/2021   Chronic midline low back pain 07/31/2021   Arthritic-like pain 02/21/2021   Prediabetes 07/29/2020   Hyperlipidemia 07/29/2020   Osteoarthritis of spine with radiculopathy, cervical region 03/22/2016   Past Medical History:  Diagnosis Date   Asthma    GERD (gastroesophageal reflux disease)     Family History  Problem Relation Age of Onset   Heart disease Mother    Dementia Father    Hypertension Sister    Diabetes Son    Diabetes type I Son    Diabetes Paternal Grandmother    Diabetes Son    Diabetes type II Son    Autism Son        middle son     Past Surgical History:  Procedure Laterality Date   PILONIDAL CYST EXCISION     Social History   Occupational History   Not on file  Tobacco Use   Smoking status: Never    Passive exposure: Never   Smokeless tobacco: Never  Vaping Use   Vaping Use: Never used  Substance and Sexual Activity   Alcohol use: Not Currently    Alcohol/week: 0.0 standard drinks of alcohol    Comment: occ   Drug use: No   Sexual activity: Yes

## 2021-09-21 ENCOUNTER — Encounter: Payer: Self-pay | Admitting: Orthopaedic Surgery

## 2021-09-21 ENCOUNTER — Ambulatory Visit (INDEPENDENT_AMBULATORY_CARE_PROVIDER_SITE_OTHER): Payer: Medicare PPO | Admitting: Orthopaedic Surgery

## 2021-09-21 ENCOUNTER — Ambulatory Visit (INDEPENDENT_AMBULATORY_CARE_PROVIDER_SITE_OTHER): Payer: Medicare PPO

## 2021-09-21 DIAGNOSIS — M2021 Hallux rigidus, right foot: Secondary | ICD-10-CM | POA: Diagnosis not present

## 2021-09-26 ENCOUNTER — Ambulatory Visit (INDEPENDENT_AMBULATORY_CARE_PROVIDER_SITE_OTHER): Payer: Medicare PPO | Admitting: Physician Assistant

## 2021-09-26 DIAGNOSIS — M2021 Hallux rigidus, right foot: Secondary | ICD-10-CM

## 2021-09-26 MED ORDER — SULFAMETHOXAZOLE-TRIMETHOPRIM 800-160 MG PO TABS: 1.0000 | ORAL_TABLET | Freq: Two times a day (BID) | ORAL | 0 refills | Status: DC

## 2021-09-26 NOTE — Progress Notes (Signed)
   Post-Op Visit Note   Patient: Rogelio Waynick           Date of Birth: [birth year]           MRN: [id number] Visit Date: 09/26/2021 PCP: Rema Fendt, NP   Assessment & Plan:  Chief Complaint:  Chief Complaint  Patient presents with   Right Foot - Routine Post Op   Visit Diagnoses:  1. Hallux rigidus, right foot     Plan: Patient is a pleasant 66 year old gentleman who comes in today 2 weeks status post right great toe MTP fusion 09/13/2021.  He has been doing well.  His pain is minimal.  He has been elevating is much as possible.  He denies any fevers or chills.  Examination of the great toe reveals a well-healing surgical incision with the exception of one small area to the distal third where there is slight wound dehiscence.  Very little drainage.  Minimal warmth and erythema.  No induration.  He is neurovascular intact distally.  Today, the nylon sutures were removed.  The wound was covered with wet-to-dry dressings over the dehisced area.  He will continue to do this twice daily.  I sent in Bactrim to take prophylactically.  He will follow-up with [place] next week for recheck.  In the meantime, transition to him into a postoperative shoe or he may bear weight as tolerated.  He has been instructed to continue to ice and elevate is much as possible.  Call with any concerns or questions in the meantime.  Follow-Up Instructions: Return in about 1 week (around 10/03/2021).   Orders:  No orders of the defined types were placed in this encounter.  Meds ordered this encounter  Medications   sulfamethoxazole-trimethoprim (BACTRIM DS) 800-160 MG tablet    Sig: Take 1 tablet by mouth 2 (two) times daily.    Dispense:  20 tablet    Refill:  0    Imaging: No new imaging  PMFS History: Patient Active Problem List   Diagnosis Date Noted   Hallux rigidus, right foot 09/13/2021   Chronic midline low back pain 07/31/2021   Arthritic-like pain 02/21/2021   Prediabetes 07/29/2020    Hyperlipidemia 07/29/2020   Osteoarthritis of spine with radiculopathy, cervical region 03/22/2016   Past Medical History:  Diagnosis Date   Asthma    GERD (gastroesophageal reflux disease)     Family History  Problem Relation Age of Onset   Heart disease Mother    Dementia Father    Hypertension Sister    Diabetes Son    Diabetes type I Son    Diabetes Paternal Grandmother    Diabetes Son    Diabetes type II Son    Autism Son        middle son    Past Surgical History:  Procedure Laterality Date   PILONIDAL CYST EXCISION     Social History   Occupational History   Not on file  Tobacco Use   Smoking status: Never    Passive exposure: Never   Smokeless tobacco: Never  Vaping Use   Vaping Use: Never used  Substance and Sexual Activity   Alcohol use: Not Currently    Alcohol/week: 0.0 standard drinks of alcohol    Comment: occ   Drug use: No   Sexual activity: Yes

## 2021-10-04 ENCOUNTER — Encounter: Payer: Self-pay | Admitting: Orthopaedic Surgery

## 2021-10-04 ENCOUNTER — Ambulatory Visit (INDEPENDENT_AMBULATORY_CARE_PROVIDER_SITE_OTHER): Payer: Medicare PPO | Admitting: Orthopaedic Surgery

## 2021-10-04 DIAGNOSIS — M2021 Hallux rigidus, right foot: Secondary | ICD-10-CM

## 2021-10-04 NOTE — Progress Notes (Signed)
   Post-Op Visit Note   Patient: Thomas Moody           Date of Birth: 1955-12-01           MRN: [id number] Visit Date: 10/04/2021 PCP: Rema Fendt, NP   Assessment & Plan:  Chief Complaint:  Chief Complaint  Patient presents with   Right Foot - Follow-up    Great toe fusion 09/13/2021   Visit Diagnoses:  1. Hallux rigidus, right foot     Plan: Garrett returns today for a wound check.  He underwent great toe fusion 3 weeks ago.  He saw Mardella Layman last week and had a little superficial dehiscence.  He has been treating this with moist to dry dressings.  He is doing well overall.  Reports no signs or symptoms of infection.  On my examination everything looks good.  He will finish out his oral antibiotics.  The site is healing up nicely.  No signs of infection.  He will continue moist to dry dressings twice a day and recheck in 3 weeks with two-view x-rays of the right great toe.  Follow-Up Instructions: Return in about 3 weeks (around 10/25/2021).   Orders:  No orders of the defined types were placed in this encounter.  No orders of the defined types were placed in this encounter.   Imaging: No results found.  PMFS History: Patient Active Problem List   Diagnosis Date Noted   Hallux rigidus, right foot 09/13/2021   Chronic midline low back pain 07/31/2021   Arthritic-like pain 02/21/2021   Prediabetes 07/29/2020   Hyperlipidemia 07/29/2020   Osteoarthritis of spine with radiculopathy, cervical region 03/22/2016   Past Medical History:  Diagnosis Date   Asthma    GERD (gastroesophageal reflux disease)     Family History  Problem Relation Age of Onset   Heart disease Mother    Dementia Father    Hypertension Sister    Diabetes Son    Diabetes type I Son    Diabetes Paternal Grandmother    Diabetes Son    Diabetes type II Son    Autism Son        middle son    Past Surgical History:  Procedure Laterality Date   PILONIDAL CYST EXCISION     Social History    Occupational History   Not on file  Tobacco Use   Smoking status: Never    Passive exposure: Never   Smokeless tobacco: Never  Vaping Use   Vaping Use: Never used  Substance and Sexual Activity   Alcohol use: Not Currently    Alcohol/week: 0.0 standard drinks of alcohol    Comment: occ   Drug use: No   Sexual activity: Yes

## 2021-10-25 ENCOUNTER — Ambulatory Visit (INDEPENDENT_AMBULATORY_CARE_PROVIDER_SITE_OTHER): Payer: Medicare PPO

## 2021-10-25 ENCOUNTER — Encounter: Payer: Self-pay | Admitting: Orthopaedic Surgery

## 2021-10-25 ENCOUNTER — Ambulatory Visit (INDEPENDENT_AMBULATORY_CARE_PROVIDER_SITE_OTHER): Payer: Medicare PPO | Admitting: Orthopaedic Surgery

## 2021-10-25 DIAGNOSIS — M2021 Hallux rigidus, right foot: Secondary | ICD-10-CM

## 2021-10-25 NOTE — Progress Notes (Signed)
   Post-Op Visit Note   Patient: Thomas Moody           Date of Birth: 10-02-1955           MRN: [id number] Visit Date: 10/25/2021 PCP: Rema Fendt, NP   Assessment & Plan:  Chief Complaint:  Chief Complaint  Patient presents with   Right Foot - Follow-up    Great toe fusion 09/13/2021   Visit Diagnoses:  1. Hallux rigidus of right foot     Plan: Bell is 6 weeks status post great toe fusion on 09/13/2021.  He reports no pain.  Doing well overall.  Examination of the right toe and surgical site shows fully healed surgical scar with a dry scab.  No signs of infection.  Expected postoperative swelling and edema.  There is no motion through the fusion site.  No pain to palpation.  X-rays do show a little bit of resorption across the fusion site.  This appears to be a normal finding.  Clinically he is doing well.  We will watch this closely with serial x-rays.  He will purchase carbon footplate insert for his shoe that he should wear for the next 6 weeks.  Recheck at that time with two-view x-rays of the right great toe.  Follow-Up Instructions: Return in about 6 weeks (around 12/06/2021).   Orders:  Orders Placed This Encounter  Procedures   XR Toe Great Right   No orders of the defined types were placed in this encounter.   Imaging: XR Toe Great Right  Result Date: 10/25/2021 Stable fixation of great toe MTP joint.  Slight resorption to fusion site.    PMFS History: Patient Active Problem List   Diagnosis Date Noted   Hallux rigidus, right foot 09/13/2021   Chronic midline low back pain 07/31/2021   Arthritic-like pain 02/21/2021   Prediabetes 07/29/2020   Hyperlipidemia 07/29/2020   Osteoarthritis of spine with radiculopathy, cervical region 03/22/2016   Past Medical History:  Diagnosis Date   Asthma    GERD (gastroesophageal reflux disease)     Family History  Problem Relation Age of Onset   Heart disease Mother    Dementia Father    Hypertension Sister     Diabetes Son    Diabetes type I Son    Diabetes Paternal Grandmother    Diabetes Son    Diabetes type II Son    Autism Son        middle son    Past Surgical History:  Procedure Laterality Date   PILONIDAL CYST EXCISION     Social History   Occupational History   Not on file  Tobacco Use   Smoking status: Never    Passive exposure: Never   Smokeless tobacco: Never  Vaping Use   Vaping Use: Never used  Substance and Sexual Activity   Alcohol use: Not Currently    Alcohol/week: 0.0 standard drinks of alcohol    Comment: occ   Drug use: No   Sexual activity: Yes

## 2021-11-24 ENCOUNTER — Encounter (INDEPENDENT_AMBULATORY_CARE_PROVIDER_SITE_OTHER): Payer: Self-pay

## 2021-11-26 ENCOUNTER — Other Ambulatory Visit: Payer: Self-pay | Admitting: Family

## 2021-11-26 DIAGNOSIS — R21 Rash and other nonspecific skin eruption: Secondary | ICD-10-CM

## 2021-11-27 MED ORDER — TRIAMCINOLONE ACETONIDE 0.1 % EX CREA: 1.0000 | TOPICAL_CREAM | Freq: Two times a day (BID) | CUTANEOUS | 0 refills | Status: AC

## 2021-12-06 ENCOUNTER — Encounter: Payer: Self-pay | Admitting: Orthopaedic Surgery

## 2021-12-06 ENCOUNTER — Ambulatory Visit (INDEPENDENT_AMBULATORY_CARE_PROVIDER_SITE_OTHER): Payer: Medicare PPO | Admitting: Physician Assistant

## 2021-12-06 ENCOUNTER — Ambulatory Visit (INDEPENDENT_AMBULATORY_CARE_PROVIDER_SITE_OTHER): Payer: Medicare PPO

## 2021-12-06 DIAGNOSIS — M2021 Hallux rigidus, right foot: Secondary | ICD-10-CM | POA: Diagnosis not present

## 2021-12-06 NOTE — Progress Notes (Signed)
   Post-Op Visit Note   Patient: Thomas Moody           Date of Birth: [birth year]           MRN: [id number] Visit Date: 12/06/2021 PCP: Camillia Herter, NP   Assessment & Plan:  Chief Complaint:  Chief Complaint  Patient presents with   Right Foot - Follow-up    Great toe fusion 09/13/2021   Visit Diagnoses:  1. Hallux rigidus of right foot     Plan: Patient is a pleasant 66 year old gentleman who comes in today 3 months status post right great toe fusion 09/13/2021.  He has been doing well.  He notes occasional discomfort but nothing more.  He has been wearing a carbon footplate which does help.  Examination of his right foot reveals a fully healed surgical scar without complication.  No range of motion through the joint.  He is neurovascular intact distally.  This point, he will continue with activity as tolerated.  Would like to see him back in 3 months for repeat evaluation and x-rays of the right great toe.  Call with concerns or questions in the meantime.  Follow-Up Instructions: Return in about 3 months (around 03/08/2022).   Orders:  Orders Placed This Encounter  Procedures   XR Toe Great Right   No orders of the defined types were placed in this encounter.   Imaging: No results found.  PMFS History: Patient Active Problem List   Diagnosis Date Noted   Hallux rigidus, right foot 09/13/2021   Chronic midline low back pain 07/31/2021   Arthritic-like pain 02/21/2021   Prediabetes 07/29/2020   Hyperlipidemia 07/29/2020   Osteoarthritis of spine with radiculopathy, cervical region 03/22/2016   Past Medical History:  Diagnosis Date   Asthma    GERD (gastroesophageal reflux disease)     Family History  Problem Relation Age of Onset   Heart disease Mother    Dementia Father    Hypertension Sister    Diabetes Son    Diabetes type I Son    Diabetes Paternal Grandmother    Diabetes Son    Diabetes type II Son    Autism Son        middle son    Past Surgical  History:  Procedure Laterality Date   PILONIDAL CYST EXCISION     Social History   Occupational History   Not on file  Tobacco Use   Smoking status: Never    Passive exposure: Never   Smokeless tobacco: Never  Vaping Use   Vaping Use: Never used  Substance and Sexual Activity   Alcohol use: Not Currently    Alcohol/week: 0.0 standard drinks of alcohol    Comment: occ   Drug use: No   Sexual activity: Yes

## 2021-12-28 ENCOUNTER — Other Ambulatory Visit (HOSPITAL_COMMUNITY): Payer: Self-pay

## 2022-01-08 NOTE — Progress Notes (Signed)
Patient ID: Leny Morozov, male    DOB: 10/07/1955  MRN: [id number]  CC: Chronic Care Management   Subjective: Dakota Vanwart is a 66 y.o. male who presents for chronic care management.  His concerns today include:  - Doing well on Atorvastatin, no issues/concerns.  - Chronic low back pain persisting. Reports in the past told decreased cartilage of low back. No recent trauma/injury. He was in the [employer] which contributes to history. 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 Meloxicam helps some. Prednisone which he takes to help for toe pain also helps with back pain.   Patient Active Problem List   Diagnosis Date Noted   Hallux rigidus, right foot 09/13/2021   Chronic midline low back pain 07/31/2021   Arthritic-like pain 02/21/2021   Prediabetes 07/29/2020   Hyperlipidemia 07/29/2020   Osteoarthritis of spine with radiculopathy, cervical region 03/22/2016     Current Outpatient Medications on File Prior to Visit  Medication Sig Dispense Refill   aspirin 81 MG tablet Take 81 mg by mouth daily.     CINNAMON PO Take 1 tablet by mouth daily.     meclizine (ANTIVERT) 12.5 MG tablet Take 1 tablet (12.5 mg total) by mouth 3 (three) times daily as needed for dizziness. 30 tablet 0   ondansetron (ZOFRAN ODT) 4 MG disintegrating tablet Take 1 tablet (4 mg total) by mouth every 8 (eight) hours as needed for nausea or vomiting. 20 tablet 0   ondansetron (ZOFRAN) 4 MG tablet Take 1 tablet (4 mg total) by mouth every 8 (eight) hours as needed for nausea or vomiting. 40 tablet 0   predniSONE (STERAPRED UNI-PAK 21 TAB) 10 MG (21) TBPK tablet Take as directed 21 tablet 3   sulfamethoxazole-trimethoprim (BACTRIM DS) 800-160 MG tablet Take 1 tablet by mouth 2 (two) times daily. 20 tablet 0   triamcinolone cream (KENALOG) 0.1 % Apply 1 Application topically 2 (two) times  daily. 80 g 0   No current facility-administered medications on file prior to visit.    No Known Allergies  Social History   Socioeconomic History   Marital status: Married    Spouse name: Not on file   Number of children: Not on file   Years of education: Not on file   Highest education level: Not on file  Occupational History   Not on file  Tobacco Use   Smoking status: Never    Passive exposure: Never   Smokeless tobacco: Never  Vaping Use   Vaping Use: Never used  Substance and Sexual Activity   Alcohol use: Not Currently    Alcohol/week: 0.0 standard drinks of alcohol    Comment: occ   Drug use: No   Sexual activity: Yes  Other Topics Concern   Not on file  Social History Narrative   Married; 3 sons   Social Determinants of Health   Financial Resource Strain: Not on file  Food Insecurity: Not on file  Transportation Needs: Not on file  Physical Activity: Not on file  Stress: Not on file  Social Connections: Not on file  Intimate Partner Violence: Not on file    Family History  Problem Relation Age of Onset   Heart disease Mother    Dementia Father    Hypertension Sister    Diabetes Son    Diabetes type I Son  Diabetes Paternal Grandmother    Diabetes Son    Diabetes type II Son    Autism Son        middle son    Past Surgical History:  Procedure Laterality Date   PILONIDAL CYST EXCISION      ROS: Review of Systems Negative except as stated above  PHYSICAL EXAM: BP 122/79 (BP Location: Left Arm, Patient Position: Sitting, Cuff Size: Large)   Pulse 100   Temp 98.3 F (36.8 C)   Resp 16   Ht 5' 11.77" (1.823 m)   Wt 220 lb (99.8 kg)   SpO2 99%   BMI 30.03 kg/m   Physical Exam HENT:     Head: Normocephalic and atraumatic.  Eyes:     Extraocular Movements: Extraocular movements intact.     Conjunctiva/sclera: Conjunctivae normal.     Pupils: Pupils are equal, round, and reactive to light.  Cardiovascular:     Rate and Rhythm:  Normal rate and regular rhythm.     Pulses: Normal pulses.     Heart sounds: Normal heart sounds.  Pulmonary:     Effort: Pulmonary effort is normal.     Breath sounds: Normal breath sounds.  Musculoskeletal:     Cervical back: Normal range of motion and neck supple.  Neurological:     General: No focal deficit present.     Mental Status: He is alert and oriented to person, place, and time.  Psychiatric:        Mood and Affect: Mood normal.        Behavior: Behavior normal.    Results for orders placed or performed in visit on 01/11/22  POCT glycosylated hemoglobin (Hb A1C)  Result Value Ref Range   Hemoglobin A1C 6.0 (A) 4.0 - 5.6 %   HbA1c POC (<> result, manual entry)     HbA1c, POC (prediabetic range)     HbA1c, POC (controlled diabetic range)       ASSESSMENT AND PLAN: 1. Prediabetes - Hemoglobin A1c 6.0% and remaining consistent with prediabetes.  - Recheck in 6 months.  - POCT glycosylated hemoglobin (Hb A1C)  2. Hyperlipidemia, unspecified hyperlipidemia type - Continue Atorvastatin as prescribed.  - Update lipid panel.  - Follow-up with primary provider as scheduled.  - Lipid panel - atorvastatin (LIPITOR) 40 MG tablet; Take 1 tablet (40 mg total) by mouth daily.  Dispense: 30 tablet; Refill: 2  3. Chronic midline low back pain with bilateral sciatica - Continue Meloxicam as prescribed.  - Referral to Orthopedic Surgery for further evaluation/management. - meloxicam (MOBIC) 7.5 MG tablet; Take 1 tablet (7.5 mg total) by mouth daily.  Dispense: 30 tablet; Refill: 2 - Ambulatory referral to Orthopedic Surgery  4. Need for immunization against influenza - Administered.  - Flu Vaccine QUAD High Dose(Fluad)   Patient was given the opportunity to ask questions.  Patient verbalized understanding of the plan and was able to repeat key elements of the plan. Patient was given clear instructions to go to Emergency Department or return to medical center if symptoms  don't improve, worsen, or new problems develop.The patient verbalized understanding.   Orders Placed This Encounter  Procedures   Flu Vaccine QUAD High Dose(Fluad)   Lipid panel   Ambulatory referral to Orthopedic Surgery   POCT glycosylated hemoglobin (Hb A1C)     Requested Prescriptions   Signed Prescriptions Disp Refills   meloxicam (MOBIC) 7.5 MG tablet 30 tablet 2    Sig: Take 1 tablet (7.5 mg total)  by mouth daily.   atorvastatin (LIPITOR) 40 MG tablet 30 tablet 2    Sig: Take 1 tablet (40 mg total) by mouth daily.    Return for Follow-Up or next available 3 to 6 months chronic care mgmt .  Rema Fendt, NP

## 2022-01-11 ENCOUNTER — Ambulatory Visit (INDEPENDENT_AMBULATORY_CARE_PROVIDER_SITE_OTHER): Payer: Medicare PPO | Admitting: Family

## 2022-01-11 ENCOUNTER — Encounter: Payer: Self-pay | Admitting: Family

## 2022-01-11 VITALS — BP 122/79 | HR 100 | Temp 98.3°F | Resp 16 | Ht 71.77 in | Wt 220.0 lb

## 2022-01-11 DIAGNOSIS — E785 Hyperlipidemia, unspecified: Secondary | ICD-10-CM | POA: Diagnosis not present

## 2022-01-11 DIAGNOSIS — M5441 Lumbago with sciatica, right side: Secondary | ICD-10-CM

## 2022-01-11 DIAGNOSIS — M5442 Lumbago with sciatica, left side: Secondary | ICD-10-CM | POA: Diagnosis not present

## 2022-01-11 DIAGNOSIS — R7303 Prediabetes: Secondary | ICD-10-CM

## 2022-01-11 DIAGNOSIS — G8929 Other chronic pain: Secondary | ICD-10-CM

## 2022-01-11 DIAGNOSIS — Z23 Encounter for immunization: Secondary | ICD-10-CM | POA: Diagnosis not present

## 2022-01-11 LAB — POCT GLYCOSYLATED HEMOGLOBIN (HGB A1C): Hemoglobin A1C: 6 % — AB (ref 4.0–5.6)

## 2022-01-11 MED ORDER — ATORVASTATIN CALCIUM 40 MG PO TABS: 40.0000 mg | ORAL_TABLET | Freq: Every day | ORAL | 2 refills | Status: DC

## 2022-01-11 MED ORDER — MELOXICAM 7.5 MG PO TABS: 7.5000 mg | ORAL_TABLET | Freq: Every day | ORAL | 2 refills | Status: AC

## 2022-01-11 NOTE — Progress Notes (Signed)
.  Pt presents for chronic care management   -low back pain  -going on for years wants to get treatment

## 2022-01-12 LAB — LIPID PANEL
Chol/HDL Ratio: 2.2 ratio (ref 0.0–5.0)
Cholesterol, Total: 127 mg/dL (ref 100–199)
HDL: 58 mg/dL (ref >39)
LDL Chol Calc (NIH): 49 mg/dL (ref 0–99)
Triglycerides: 109 mg/dL (ref 0–149)
VLDL Cholesterol Cal: 20 mg/dL (ref 5–40)

## 2022-01-17 ENCOUNTER — Encounter: Payer: Self-pay | Admitting: Orthopaedic Surgery

## 2022-01-17 ENCOUNTER — Ambulatory Visit (INDEPENDENT_AMBULATORY_CARE_PROVIDER_SITE_OTHER): Payer: Medicare PPO | Admitting: Orthopaedic Surgery

## 2022-01-17 DIAGNOSIS — G8929 Other chronic pain: Secondary | ICD-10-CM | POA: Diagnosis not present

## 2022-01-17 DIAGNOSIS — M5441 Lumbago with sciatica, right side: Secondary | ICD-10-CM

## 2022-01-17 MED ORDER — PREDNISONE 10 MG (21) PO TBPK: ORAL_TABLET | ORAL | 0 refills | Status: DC

## 2022-01-17 NOTE — Progress Notes (Signed)
Office Visit Note   Patient: Thomas Moody           Date of Birth: June 05, 1955           MRN: [id number] Visit Date: 01/17/2022              Requested by: Rema Fendt, NP [address] [place],  [place] [address] PCP: Rema Fendt, NP   Assessment & Plan: Visit Diagnoses:  1. Chronic midline low back pain with right-sided sciatica     Plan: Impression is chronic low back pain with right lower extremity radiculopathy.  MRI findings showed disc bulging L3-4 as well as severe facet arthropathy L4-5.  We again discussed starting another steroid pack versus ESI.  He would like to try 1 more steroid pack but is not interested in injection as of yet.  He would like to try a course of physical therapy and have made a referral.  We have discussed follow-up with Dr. Christell Constant.  He will follow-up with [place] as needed.  Follow-Up Instructions: Return if symptoms worsen or fail to improve.   Orders:  Orders Placed This Encounter  Procedures   Ambulatory referral to Physical Therapy   Meds ordered this encounter  Medications   predniSONE (STERAPRED UNI-PAK 21 TAB) 10 MG (21) TBPK tablet    Sig: Take as directed    Dispense:  21 tablet    Refill:  0      Procedures: No procedures performed   Clinical Data: No additional findings.   Subjective: Chief Complaint  Patient presents with   Lower Back - Pain    HPI patient is a pleasant 66 year old gentleman who comes in today with recurrent low back pain and right lower extremity radiculopathy.  MRI from the summer showed disc bulging at L3-4 as well as facet arthritis at L4-5.  He was started on a steroid pack which helped until about 1 to 2 weeks ago.  He has recently been taking meloxicam without significant relief.  The pain he has is to the middle of the lower back and radiates down the right leg.  Symptoms are worse when he is trying to stand after being on his knees.  He denies any paresthesias.  No significant  weakness.  No bowel or bladder change.  He has not previously undergone ESI.  Review of Systems as detailed in HPI.  All others reviewed and are negative.   Objective: Vital Signs: There were no vitals taken for this visit.  Physical Exam gentleman in no acute distress.  Alert and oriented x 3.  Ortho Exam lumbar spine exam has mild tenderness to the lower lumbar levels.  No paraspinous tenderness.  Slight increased pain with lumbar flexion.  Positive straight leg raise on the right.  No focal weakness.  He is neurovascular intact distally.  Specialty Comments:  No specialty comments available.  Imaging: No new imaging   PMFS History: Patient Active Problem List   Diagnosis Date Noted   Hallux rigidus, right foot 09/13/2021   Chronic midline low back pain 07/31/2021   Arthritic-like pain 02/21/2021   Prediabetes 07/29/2020   Hyperlipidemia 07/29/2020   Osteoarthritis of spine with radiculopathy, cervical region 03/22/2016   Past Medical History:  Diagnosis Date   Asthma    GERD (gastroesophageal reflux disease)     Family History  Problem Relation Age of Onset   Heart disease Mother    Dementia Father    Hypertension Sister  Diabetes Son    Diabetes type I Son    Diabetes Paternal Grandmother    Diabetes Son    Diabetes type II Son    Autism Son        middle son    Past Surgical History:  Procedure Laterality Date   PILONIDAL CYST EXCISION     Social History   Occupational History   Not on file  Tobacco Use   Smoking status: Never    Passive exposure: Never   Smokeless tobacco: Never  Vaping Use   Vaping Use: Never used  Substance and Sexual Activity   Alcohol use: Not Currently    Alcohol/week: 0.0 standard drinks of alcohol    Comment: occ   Drug use: No   Sexual activity: Yes

## 2022-01-29 ENCOUNTER — Encounter: Payer: Self-pay | Admitting: Physical Therapy

## 2022-01-29 ENCOUNTER — Ambulatory Visit (INDEPENDENT_AMBULATORY_CARE_PROVIDER_SITE_OTHER): Payer: Medicare PPO | Admitting: Physical Therapy

## 2022-01-29 DIAGNOSIS — G8929 Other chronic pain: Secondary | ICD-10-CM | POA: Diagnosis not present

## 2022-01-29 DIAGNOSIS — M5442 Lumbago with sciatica, left side: Secondary | ICD-10-CM

## 2022-01-29 DIAGNOSIS — M6281 Muscle weakness (generalized): Secondary | ICD-10-CM | POA: Diagnosis not present

## 2022-01-29 DIAGNOSIS — M5441 Lumbago with sciatica, right side: Secondary | ICD-10-CM

## 2022-01-29 DIAGNOSIS — R262 Difficulty in walking, not elsewhere classified: Secondary | ICD-10-CM | POA: Diagnosis not present

## 2022-01-29 NOTE — Therapy (Signed)
OUTPATIENT PHYSICAL THERAPY THORACOLUMBAR EVALUATION   Patient Name: Thomas CoryJohn Sabir Kassin MRN: [id number] DOB:01/16/1956, 66 y.o., male Today's Date: 01/29/2022  END OF SESSION:  PT End of Session - 01/29/22 0941     Visit Number 1    Number of Visits 12    Date for PT Re-Evaluation 03/29/22    Authorization Type [employer]    Authorization - Visit Number 1    Authorization - Number of Visits 12    PT Start Time 0935    PT Stop Time 1015    PT Time Calculation (min) 40 min    Activity Tolerance Patient tolerated treatment well    Behavior During Therapy [facility] for tasks assessed/performed             Past Medical History:  Diagnosis Date   Asthma    GERD (gastroesophageal reflux disease)    Past Surgical History:  Procedure Laterality Date   PILONIDAL CYST EXCISION     Patient Active Problem List   Diagnosis Date Noted   Hallux rigidus, right foot 09/13/2021   Chronic midline low back pain 07/31/2021   Arthritic-like pain 02/21/2021   Prediabetes 07/29/2020   Hyperlipidemia 07/29/2020   Osteoarthritis of spine with radiculopathy, cervical region 03/22/2016    PCP:  Rema FendtStephens, Amy J, NP   REFERRING PROVIDER:  Cristie HemStanbery, Mary L, PA-C Ref Provider  REFERRING DIAG: [phone],G89.29 (ICD-10-CM) - Chronic midline low back pain with right-sided sciatica  Rationale for Evaluation and Treatment: Rehabilitation  THERAPY DIAG:  Chronic bilateral low back pain with bilateral sciatica  Muscle weakness (generalized)  Difficulty in walking, not elsewhere classified  ONSET DATE: ongoing for years, gradually worsening over the last few months  SUBJECTIVE:  SUBJECTIVE STATEMENT: Pt stating back pain increases when trying to stand up going form sitting to standing. Pt also reporting pain with any lifting activities such as taking out the trash. Pt reporting he is sleeping is not disturbed.  Prolonged sitting is worse.   PERTINENT HISTORY:  Asthma, GERD, h/o low back pain  PAIN:  NPRS scale: 3/10 Pain location: midline low back Pain description: achy Aggravating factors: prolonged sitting, lifting, twisting Relieving factors: over the counter pain meds,   PRECAUTIONS: None  WEIGHT BEARING RESTRICTIONS: No  FALLS:  Has patient fallen in last 6 months? No  LIVING ENVIRONMENT: Lives with: lives with their family and lives with their spouse Lives in: House/apartment Stairs: Yes: Internal: 1 flight steps; on right going up Has following equipment at home: None  OCCUPATION: retired from [employer]  PLOF: Independent  PATIENT GOALS: Stop hurting   OBJECTIVE:   DIAGNOSTIC FINDINGS:  IMPRESSION: 08/13/21 1. Severe facet arthrosis at L4-L5 with grade 1 anterolisthesis and mild left neural foraminal stenosis. 2. Mild left L3-4 neural foraminal stenosis with left asymmetric disc bulge and left-greater-than-right facet hypertrophy. Annular fissure in close proximity to the exiting left L3 nerve root could serve as a source of left L3 radiculopathy.  PATIENT SURVEYS:  01/29/22: FOTO eval:    57%   SCREENING FOR RED FLAGS: Bowel or bladder incontinence: No Cauda equina syndrome: No  COGNITION: Overall cognitive status: WFL normal      SENSATION: WFL    POSTURE: rounded shoulders, forward head, and decreased lumbar lordosis  PALPATION: TTP: lumbar paraspinals  LUMBAR ROM:   AROM eval  Flexion 65  Extension 20  Right lateral flexion 26  Left lateral flexion 30  Right rotation WFL   Left rotation WFL   (Blank rows = not tested)  LOWER EXTREMITY ROM:     Active  Right eval  Left eval  Hip flexion 105 102  Hip extension    Hip abduction    Hip adduction    Hip internal rotation    Hip external rotation    Knee flexion    Knee extension     (Blank rows = not tested)  LOWER EXTREMITY MMT:    MMT Right eval Left eval  Hip flexion 5/5 5/5  Hip extension 4/5  4/5  Hip abduction 5/5 5/5  Hip adduction 5/5 5/5  Hip internal rotation    Hip external rotation    Knee flexion    Knee extension     (Blank rows = not tested)  LUMBAR SPECIAL TESTS:  01/29/22: Slump test: NegativeRt and Left negative  FUNCTIONAL TESTS:  01/29/22: 5 times sit to stand: 12 seconds with no UE support  GAIT: Distance walked: 50 feet  Assistive device utilized: None Level of assistance: Complete Independence Comments: WFL  TODAY'S TREATMENT:  DATE:  01/29/22:   Therex:    HEP instruction/performance c cues for techniques, handout provided.  Trial set performed of each for comprehension and symptom assessment.  See below for exercise list  PATIENT EDUCATION:  Education details: HEP, POC, DN handout issue and brief explanation given for possible future treatment Person educated: Patient Education method: Explanation, Demonstration, Verbal cues, and Handouts Education comprehension: verbalized understanding, returned demonstration, and verbal cues required  HOME EXERCISE PROGRAM: Access Code: [email or web address] URL: [email or web address] Date: 01/29/2022 Prepared by: Narda Amber  Exercises - Supine Bridge  - 2 x daily - 7 x weekly - 2 sets - 10 reps - 5 seconds hold - Supine Lower Trunk Rotation  - 2 x daily - 7 x weekly - 3 reps - 30 seconds hold - Supine Active Straight Leg Raise  - 2 x daily - 7 x weekly - 2 sets - 10 reps - Hooklying Single Knee to Chest Stretch  - 2 x daily - 7 x weekly - 3 reps - 30 seconds hold - Standing Lumbar  Extension at Wall - Forearms  - 2 x daily - 7 x weekly - 10 reps - 5 seconds hold  ASSESSMENT:  CLINICAL IMPRESSION: Patient is a 66 y.o. who comes to clinic with complaints of midline low back pain with mobility, strength and movement coordination deficits that impair their ability to perform usual daily and recreational functional activities without increase difficulty/symptoms at this time.  Patient to benefit from skilled PT services to address impairments and limitations to improve to previous level of function without restriction secondary to condition.   OBJECTIVE IMPAIRMENTS: decreased mobility, difficulty walking, decreased ROM, decreased strength, and pain.   ACTIVITY LIMITATIONS: lifting, bending, sitting, standing, squatting, stairs, and reach over head  PARTICIPATION LIMITATIONS: cleaning, driving, community activity, and yard work  PERSONAL FACTORS: 1-2 comorbidities: see above pertinent history  are also affecting patient's functional outcome.   REHAB POTENTIAL: Good  CLINICAL DECISION MAKING: Stable/uncomplicated  EVALUATION COMPLEXITY: Low   GOALS: Goals reviewed with patient? Yes  SHORT TERM GOALS: (target date for Short term goals are 3 weeks  02/24/22)  1. Patient will demonstrate independent use of home exercise program to maintain progress from in clinic treatments.  Goal status: New  LONG TERM GOALS: (target dates for all long term goals are 8 weeks  03/29/21 )   1. Patient will demonstrate/report pain at worst less than or equal to 2/10 to facilitate minimal limitation in daily activity secondary to pain symptoms.  Goal status: New   2. Patient will demonstrate independent use of home exercise program to facilitate ability to maintain/progress functional gains from skilled physical therapy services.  Goal status: New   3. Patient will demonstrate FOTO outcome > or = 64 % to indicate reduced disability due to condition.  Goal status: New   4. Patient  will demonstrate lumbar extension  >/= 30 degrees with no symptoms to facilitate upright standing, walking posture at PLOF s limitation.  Goal status: New   5.  Pt will be able to lift 20# from floor to overhead shelf with correct body mechanics with no pain reported.   Goal status: New   6.  Pt will be able to report standing for 30 minutes with pain </= 2/10.  Goal status: New     PLAN:  PT FREQUENCY: 1-2x/week  PT DURATION: 8 weeks  PLANNED INTERVENTIONS: Therapeutic exercises, Therapeutic activity, Neuro Muscular re-education, Balance training, Gait training, Patient/Family education, Joint mobilization, Stair  training, DME instructions, Dry Needling, Electrical stimulation, Cryotherapy, vasopneumatic device, Moist heat, Taping, Traction Ultrasound, Ionotophoresis 4mg /ml Dexamethasone, and Manual therapy.  All included unless contraindicated  PLAN FOR NEXT SESSION: Review HEP knowledge/results, lumbar mobility, mobs if needed, manual as needed, consider DN     , PT, MPT 01/29/2022, 9:42 AM  Referring diagnosis? M54.41,G89.29 (ICD-10-CM) - Chronic midline low back pain with right-sided sciatica Treatment diagnosis? (if different than referring diagnosis) M54.42, M62.81, R26.2 What was this (referring dx) caused by? []  Surgery []  Fall [x]  Ongoing issue []  Arthritis []  Other: ____________  Laterality: []  Rt []  Lt []  Both  Check all possible CPT codes:  *CHOOSE 10 OR LESS*    [x]  97110 (Therapeutic Exercise)  []  92507 (SLP Treatment)  [x]  01/31/2022 (Neuro Re-ed)   []  92526 (Swallowing Treatment)   [x]  97116 (Gait Training)   []  (Cognitive Training, 1st 15 minutes) [x]  97140 (Manual Therapy)   []  97130 (Cognitive Training, each add'l 15 minutes)  []  97164 (Re-evaluation)                              []  Other, List CPT Code ____________  [x]  97530 (Therapeutic Activities)     [x]  97535 (Self Care)   []  All codes above (97110 - 97535)  [x]  97012  (Mechanical Traction)  [x]  97014 (E-stim Unattended)  []  97032 (E-stim manual)  []  97033 (Ionto)  [x]  97035 (Ultrasound) []  97750 (Physical Performance Training) []  (Aquatic Therapy) []  97016 (Vasopneumatic Device) []  (Paraffin) []  97034 (Contrast Bath) []  97597 (Wound Care 1st 20 sq cm) []  97598 (Wound Care each add'l 20 sq cm) []  97760 (Orthotic Fabrication, Fitting, Training Initial) []  (Prosthetic Management and Training Initial) []  (Orthotic or Prosthetic Training/ Modification Subsequent)

## 2022-02-13 ENCOUNTER — Ambulatory Visit (INDEPENDENT_AMBULATORY_CARE_PROVIDER_SITE_OTHER): Payer: Medicare PPO | Admitting: Physical Therapy

## 2022-02-13 ENCOUNTER — Encounter: Payer: Self-pay | Admitting: Physical Therapy

## 2022-02-13 DIAGNOSIS — G8929 Other chronic pain: Secondary | ICD-10-CM

## 2022-02-13 DIAGNOSIS — R262 Difficulty in walking, not elsewhere classified: Secondary | ICD-10-CM | POA: Diagnosis not present

## 2022-02-13 DIAGNOSIS — R252 Cramp and spasm: Secondary | ICD-10-CM

## 2022-02-13 DIAGNOSIS — M5441 Lumbago with sciatica, right side: Secondary | ICD-10-CM | POA: Diagnosis not present

## 2022-02-13 DIAGNOSIS — M6281 Muscle weakness (generalized): Secondary | ICD-10-CM | POA: Diagnosis not present

## 2022-02-13 DIAGNOSIS — M5442 Lumbago with sciatica, left side: Secondary | ICD-10-CM | POA: Diagnosis not present

## 2022-02-13 NOTE — Therapy (Signed)
OUTPATIENT PHYSICAL THERAPY THORACOLUMBAR TREATMENT   Patient Name: Thomas Moody MRN: [id number] DOB:1955/05/15, 67 y.o., male Today's Date: 02/13/2022  END OF SESSION:  PT End of Session - 02/13/22 1056     Visit Number 2    Number of Visits 12    Date for PT Re-Evaluation 03/29/22    Authorization Type [employer]    Authorization - Visit Number 2    Authorization - Number of Visits 12    PT Start Time 1048    PT Stop Time 1128    PT Time Calculation (min) 40 min    Activity Tolerance Patient tolerated treatment well    Behavior During Therapy WFL for tasks assessed/performed              Past Medical History:  Diagnosis Date   Asthma    GERD (gastroesophageal reflux disease)    Past Surgical History:  Procedure Laterality Date   PILONIDAL CYST EXCISION     Patient Active Problem List   Diagnosis Date Noted   Hallux rigidus, right foot 09/13/2021   Chronic midline low back pain 07/31/2021   Arthritic-like pain 02/21/2021   Prediabetes 07/29/2020   Hyperlipidemia 07/29/2020   Osteoarthritis of spine with radiculopathy, cervical region 03/22/2016    PCP:  Rema Fendt, NP   REFERRING PROVIDER:  Cristie Hem, PA-C Ref Provider  REFERRING DIAG: [phone] (ICD-10-CM) - Chronic midline low back pain with right-sided sciatica  Rationale for Evaluation and Treatment: Rehabilitation  THERAPY DIAG:  Chronic bilateral low back pain with bilateral sciatica  Muscle weakness (generalized)  Difficulty in walking, not elsewhere classified  Cramp and spasm  ONSET DATE: ongoing for years, gradually worsening over the last few months  SUBJECTIVE:  SUBJECTIVE STATEMENT:  Feeling pretty good this morning, I've been trying to find time for HEP but my back is already feeling a lot better. I think I had a bout with the flu but feeling OK, doing a whole lot better now. No fever the past 2 days. As long as I'm sitting down I'm fine but if I'm up on my feet or picking things up like the trash the pain starts again   PERTINENT HISTORY:  Asthma, GERD, h/o low back pain  PAIN:  NPRS scale: 3/10 Pain location: midline low back Pain description: achy Aggravating factors: standing, picking up trash, twisting, reaching  Relieving factors: HEP, prednisone from MD   PRECAUTIONS: None  WEIGHT BEARING RESTRICTIONS: No  FALLS:  Has patient fallen in last 6 months? No  LIVING ENVIRONMENT: Lives with: lives with their family and lives with their spouse Lives in: House/apartment Stairs: Yes: Internal: 1 flight steps; on right going up Has following equipment at home: None  OCCUPATION: retired from [employer]  PLOF: Independent  PATIENT GOALS: Stop hurting   OBJECTIVE:   DIAGNOSTIC FINDINGS:  IMPRESSION: 08/13/21 1. Severe facet arthrosis at L4-L5 with grade 1 anterolisthesis and mild left neural foraminal stenosis. 2. Mild left L3-4 neural foraminal stenosis with left asymmetric disc bulge and left-greater-than-right facet hypertrophy. Annular fissure in close proximity to the exiting left L3 nerve root could serve as a source of left L3 radiculopathy.  PATIENT SURVEYS:  01/29/22: FOTO eval:    57%   SCREENING FOR RED FLAGS: Bowel or bladder incontinence: No Cauda equina syndrome: No  COGNITION: Overall cognitive status: WFL normal      SENSATION: WFL    POSTURE: rounded shoulders, forward head, and decreased lumbar lordosis  PALPATION: TTP: lumbar paraspinals  LUMBAR ROM:   AROM eval  Flexion 65  Extension 20  Right lateral flexion 26  Left lateral  flexion 30  Right rotation WFL   Left rotation WFL   (Blank rows = not tested)  LOWER EXTREMITY ROM:     Active  Right eval Left eval  Hip flexion 105 102  Hip extension    Hip abduction    Hip adduction    Hip internal rotation    Hip external rotation    Knee flexion    Knee extension     (Blank rows = not tested)  LOWER EXTREMITY MMT:    MMT Right eval Left eval  Hip flexion 5/5 5/5  Hip extension 4/5  4/5  Hip abduction 5/5 5/5  Hip adduction 5/5 5/5  Hip internal rotation    Hip external rotation    Knee flexion    Knee extension     (Blank rows = not tested)  LUMBAR SPECIAL TESTS:  01/29/22: Slump test: NegativeRt and Left negative  FUNCTIONAL TESTS:  01/29/22: 5 times sit to stand: 12 seconds with no UE support  GAIT: Distance walked: 50 feet  Assistive device utilized: None Level of assistance: Complete Independence Comments: WFL  TODAY'S TREATMENT:  DATE:    02/13/22  TherEx  SKTC 5x5 second holds B HS stretches 2x30 seconds B Piriforims stretches 2x30 seconds B Lumbar rotation stretches 5x5 seconds B PPT x15 with 3 second holds multimodal cues  Bridges x15 with 2 second holds TA set with march supine x10 B TA set with opposite/alternating UE/LE lift x10 B Seated QL stretch 2x30 seconds Hip hikes x10 B  Hip hikes + ABD x10 B 3D hip excursions x10 each direction   01/29/22:   Therex:    HEP instruction/performance c cues for techniques, handout provided.  Trial set performed of each for comprehension and symptom assessment.  See below for exercise list  PATIENT EDUCATION:  Education details: exercise form and purpose  Person educated: Patient Education method: Explanation, Demonstration, Verbal cues, and Handouts Education comprehension: verbalized understanding, returned demonstration, and verbal cues  required  HOME EXERCISE PROGRAM: Access Code: [email or web address] URL: [email or web address] Date: 01/29/2022 Prepared by: Narda Amber  Exercises - Supine Bridge  - 2 x daily - 7 x weekly - 2 sets - 10 reps - 5 seconds hold - Supine Lower Trunk Rotation  - 2 x daily - 7 x weekly - 3 reps - 30 seconds hold - Supine Active Straight Leg Raise  - 2 x daily - 7 x weekly - 2 sets - 10 reps - Hooklying Single Knee to Chest Stretch  - 2 x daily - 7 x weekly - 3 reps - 30 seconds hold - Standing Lumbar Extension at Wall - Forearms  - 2 x daily - 7 x weekly - 10 reps - 5 seconds hold  ASSESSMENT:  CLINICAL IMPRESSION:  Tavius arrives today feeling better, had a recent bout with the flu and still recovery but back pain is already improving from HEP. Reviewed HEP exercise, also expanded flexibility/mobility program and added some core strengthening today as well. Core very weak- had a hard time coordinating PPT. Seemed to tolerate all activities well this morning- will continue to progress as able and tolerated.   OBJECTIVE IMPAIRMENTS: decreased mobility, difficulty walking, decreased ROM, decreased strength, and pain.   ACTIVITY LIMITATIONS: lifting, bending, sitting, standing, squatting, stairs, and reach over head  PARTICIPATION LIMITATIONS: cleaning, driving, community activity, and yard work  PERSONAL FACTORS: 1-2 comorbidities: see above pertinent history  are also affecting patient's functional outcome.   REHAB POTENTIAL: Good  CLINICAL DECISION MAKING: Stable/uncomplicated  EVALUATION COMPLEXITY: Low   GOALS: Goals reviewed with patient? Yes  SHORT TERM GOALS: (target date for Short term goals are 3 weeks  02/24/22)  1. Patient will demonstrate independent use of home exercise program to maintain progress from in clinic treatments.  Goal status: New  LONG TERM GOALS: (target dates for all long term goals are 8 weeks  03/29/21 )   1. Patient will demonstrate/report pain  at worst less than or equal to 2/10 to facilitate minimal limitation in daily activity secondary to pain symptoms.  Goal status: New   2. Patient will demonstrate independent use of home exercise program to facilitate ability to maintain/progress functional gains from skilled physical therapy services.  Goal status: New   3. Patient will demonstrate FOTO outcome > or = 64 % to indicate reduced disability due to condition.  Goal status: New   4. Patient will demonstrate lumbar extension  >/= 30 degrees with no symptoms to facilitate upright standing, walking posture at PLOF s limitation.  Goal status: New   5.  Pt will be able to lift 20# from floor  to overhead shelf with correct body mechanics with no pain reported.   Goal status: New   6.  Pt will be able to report standing for 30 minutes with pain </= 2/10.  Goal status: New     PLAN:  PT FREQUENCY: 1-2x/week  PT DURATION: 8 weeks  PLANNED INTERVENTIONS: Therapeutic exercises, Therapeutic activity, Neuro Muscular re-education, Balance training, Gait training, Patient/Family education, Joint mobilization, Stair training, DME instructions, Dry Needling, Electrical stimulation, Cryotherapy, vasopneumatic device, Moist heat, Taping, Traction Ultrasound, Ionotophoresis 4mg /ml Dexamethasone, and Manual therapy.  All included unless contraindicated  PLAN FOR NEXT SESSION: lumbar mobility, mobs if needed, manual as needed, consider DN, hip flexibility, continue working on building core strength       Deniece Ree PT DPT PN2  02/13/2022, 11:29 AM   Referring diagnosis? M54.41,G89.29 (ICD-10-CM) - Chronic midline low back pain with right-sided sciatica Treatment diagnosis? (if different than referring diagnosis) M54.42, M62.81, R26.2 What was this (referring dx) caused by? []  Surgery []  Fall [x]  Ongoing issue []  Arthritis []  Other: ____________  Laterality: []  Rt []  Lt []  Both  Check all possible CPT codes:  *CHOOSE 10  OR LESS*    [x]  97110 (Therapeutic Exercise)  []  92507 (SLP Treatment)  [x]  97112 (Neuro Re-ed)   []  92526 (Swallowing Treatment)   [x]  97116 (Gait Training)   []  [id number] (Cognitive Training, 1st 15 minutes) [x]  97140 (Manual Therapy)   []  97130 (Cognitive Training, each add'l 15 minutes)  []  97164 (Re-evaluation)                              []  Other, List CPT Code ____________  [x]  97530 (Therapeutic Activities)     [x]  97535 (Self Care)   []  All codes above (97110 - 97535)  [x]  97012 (Mechanical Traction)  [x]  97014 (E-stim Unattended)  []  97032 (E-stim manual)  []  97033 (Ionto)  [x]  97035 (Ultrasound) []  97750 (Physical Performance Training) []  [id number] (Aquatic Therapy) []  97016 (Vasopneumatic Device) []  [id number] (Paraffin) []  97034 (Contrast Bath) []  97597 (Wound Care 1st 20 sq cm) []  97598 (Wound Care each add'l 20 sq cm) []  97760 (Orthotic Fabrication, Fitting, Training Initial) []  [id number] (Prosthetic Management and Training Initial) []  [phone] (Orthotic or Prosthetic Training/ Modification Subsequent)

## 2022-02-14 ENCOUNTER — Telehealth: Payer: Self-pay | Admitting: Rehabilitative and Restorative Service Providers"

## 2022-02-14 ENCOUNTER — Encounter: Payer: Medicare PPO | Admitting: Rehabilitative and Restorative Service Providers"

## 2022-02-14 NOTE — Therapy (Incomplete)
OUTPATIENT PHYSICAL THERAPY TREATMENT   Patient Name: Thomas Moody MRN: [id number] DOB:03/25/1955, 67 y.o., male Today's Date: 02/14/2022  END OF SESSION:     Past Medical History:  Diagnosis Date   Asthma    GERD (gastroesophageal reflux disease)    Past Surgical History:  Procedure Laterality Date   PILONIDAL CYST EXCISION     Patient Active Problem List   Diagnosis Date Noted   Hallux rigidus, right foot 09/13/2021   Chronic midline low back pain 07/31/2021   Arthritic-like pain 02/21/2021   Prediabetes 07/29/2020   Hyperlipidemia 07/29/2020   Osteoarthritis of spine with radiculopathy, cervical region 03/22/2016    PCP:  Rema Fendt, NP   REFERRING PROVIDER:  Cristie Hem, PA-C Ref Provider  REFERRING DIAG: [phone] (ICD-10-CM) - Chronic midline low back pain with right-sided sciatica  Rationale for Evaluation and Treatment: Rehabilitation  THERAPY DIAG:  No diagnosis found.  ONSET DATE: ongoing for years, gradually worsening over the last few months  SUBJECTIVE:                                                                                                                                                                                                                                                                                                                                                                                   SUBJECTIVE STATEMENT:  Feeling pretty good this morning, I've been trying to find time for HEP but my back is already feeling a lot better. I think I had a bout with the flu but feeling OK, doing a whole lot better now. No fever the past 2 days. As long as I'm sitting down I'm fine  but if I'm up on my feet or picking things up like the trash the pain starts again   PERTINENT HISTORY:  Asthma, GERD, h/o low back pain  PAIN:  NPRS scale: 3/10 Pain location: midline low back Pain description: achy Aggravating  factors: standing, picking up trash, twisting, reaching  Relieving factors: HEP, prednisone from MD   PRECAUTIONS: None  WEIGHT BEARING RESTRICTIONS: No  FALLS:  Has patient fallen in last 6 months? No  LIVING ENVIRONMENT: Lives with: lives with their family and lives with their spouse Lives in: House/apartment Stairs: Yes: Internal: 1 flight steps; on right going up Has following equipment at home: None  OCCUPATION: retired from [employer]  PLOF: Independent  PATIENT GOALS: Stop hurting   OBJECTIVE:   DIAGNOSTIC FINDINGS:  01/29/2022 review IMPRESSION: 08/13/21 1. Severe facet arthrosis at L4-L5 with grade 1 anterolisthesis and mild left neural foraminal stenosis. 2. Mild left L3-4 neural foraminal stenosis with left asymmetric disc bulge and left-greater-than-right facet hypertrophy. Annular fissure in close proximity to the exiting left L3 nerve root could serve as a source of left L3 radiculopathy.  PATIENT SURVEYS:  01/29/22: FOTO eval:    57%   SCREENING FOR RED FLAGS: 01/29/2022 Bowel or bladder incontinence: No Cauda equina syndrome: No  COGNITION: 01/29/2022 Overall cognitive status: WFL normal      SENSATION: 01/29/2022 WFL  POSTURE: 01/29/2022  rounded shoulders, forward head, and decreased lumbar lordosis  PALPATION: 01/29/2022 TTP: lumbar paraspinals  LUMBAR ROM:   AROM 01/29/2022  Flexion 65  Extension 20  Right lateral flexion 26  Left lateral flexion 30  Right rotation WFL   Left rotation WFL   (Blank rows = not tested)  LOWER EXTREMITY ROM:     Active  Right 01/29/2022 Left 01/29/2022  Hip flexion 105 102  Hip extension    Hip abduction    Hip adduction    Hip internal rotation    Hip external rotation    Knee flexion    Knee extension     (Blank rows = not tested)  LOWER EXTREMITY MMT:    MMT Right 01/29/2022 Left 01/29/2022  Hip flexion 5/5 5/5  Hip extension 4/5  4/5  Hip abduction 5/5 5/5  Hip adduction  5/5 5/5  Hip internal rotation    Hip external rotation    Knee flexion    Knee extension     (Blank rows = not tested)  LUMBAR SPECIAL TESTS:  01/29/22: Slump test: NegativeRt and Left negative  FUNCTIONAL TESTS:  01/29/22: 5 times sit to stand: 12 seconds with no UE support  GAIT: 01/29/2022 Distance walked: 50 feet  Assistive device utilized: None Level of assistance: Complete Independence Comments: WFL  TODAY'S TREATMENT:                                                                                                                DATE: 02/14/2022  TherEx  SKTC 5x5 second holds B HS stretches 2x30 seconds B Piriforims stretches 2x30 seconds B Lumbar rotation  stretches 5x5 seconds B PPT x15 with 3 second holds multimodal cues  Bridges x15 with 2 second holds TA set with march supine x10 B TA set with opposite/alternating UE/LE lift x10 B Seated QL stretch 2x30 seconds Hip hikes x10 B  Hip hikes + ABD x10 B 3D hip excursions x10 each direction  TODAY'S TREATMENT:                                                                                                                DATE: 02/13/2022  TherEx  SKTC 5x5 second holds B HS stretches 2x30 seconds B Piriforims stretches 2x30 seconds B Lumbar rotation stretches 5x5 seconds B PPT x15 with 3 second holds multimodal cues  Bridges x15 with 2 second holds TA set with march supine x10 B TA set with opposite/alternating UE/LE lift x10 B Seated QL stretch 2x30 seconds Hip hikes x10 B  Hip hikes + ABD x10 B 3D hip excursions x10 each direction   TODAY'S TREATMENT:                                                                                                                DATE: 01/29/2022  Therex:    HEP instruction/performance c cues for techniques, handout provided.  Trial set performed of each for comprehension and symptom assessment.  See below for exercise list  PATIENT EDUCATION:  Education details: exercise  form and purpose  Person educated: Patient Education method: Explanation, Demonstration, Verbal cues, and Handouts Education comprehension: verbalized understanding, returned demonstration, and verbal cues required  HOME EXERCISE PROGRAM: Access Code: [email or web address] URL: [email or web address] Date: 01/29/2022 Prepared by: Narda Amber  Exercises - Supine Bridge  - 2 x daily - 7 x weekly - 2 sets - 10 reps - 5 seconds hold - Supine Lower Trunk Rotation  - 2 x daily - 7 x weekly - 3 reps - 30 seconds hold - Supine Active Straight Leg Raise  - 2 x daily - 7 x weekly - 2 sets - 10 reps - Hooklying Single Knee to Chest Stretch  - 2 x daily - 7 x weekly - 3 reps - 30 seconds hold - Standing Lumbar Extension at Wall - Forearms  - 2 x daily - 7 x weekly - 10 reps - 5 seconds hold  ASSESSMENT:  CLINICAL IMPRESSION:  Jaxx arrives today feeling better, had a recent bout with the flu and still recovery but back pain is already improving from HEP. Reviewed HEP exercise, also expanded flexibility/mobility program and added some core strengthening today as well.  Core very weak- had a hard time coordinating PPT. Seemed to tolerate all activities well this morning- will continue to progress as able and tolerated.   OBJECTIVE IMPAIRMENTS: decreased mobility, difficulty walking, decreased ROM, decreased strength, and pain.   ACTIVITY LIMITATIONS: lifting, bending, sitting, standing, squatting, stairs, and reach over head  PARTICIPATION LIMITATIONS: cleaning, driving, community activity, and yard work  PERSONAL FACTORS: 1-2 comorbidities: see above pertinent history  are also affecting patient's functional outcome.   REHAB POTENTIAL: Good  CLINICAL DECISION MAKING: Stable/uncomplicated  EVALUATION COMPLEXITY: Low   GOALS: Goals reviewed with patient? Yes  SHORT TERM GOALS: (target date for Short term goals are 3 weeks  02/24/22)  1. Patient will demonstrate independent use of home  exercise program to maintain progress from in clinic treatments.  Goal status: on going 02/14/2022  LONG TERM GOALS: (target dates for all long term goals are 8 weeks  03/29/21 )   1. Patient will demonstrate/report pain at worst less than or equal to 2/10 to facilitate minimal limitation in daily activity secondary to pain symptoms.  Goal status: New   2. Patient will demonstrate independent use of home exercise program to facilitate ability to maintain/progress functional gains from skilled physical therapy services.  Goal status: New   3. Patient will demonstrate FOTO outcome > or = 64 % to indicate reduced disability due to condition.  Goal status: New   4. Patient will demonstrate lumbar extension  >/= 30 degrees with no symptoms to facilitate upright standing, walking posture at PLOF s limitation.  Goal status: New   5.  Pt will be able to lift 20# from floor to overhead shelf with correct body mechanics with no pain reported.   Goal status: New   6.  Pt will be able to report standing for 30 minutes with pain </= 2/10.  Goal status: New     PLAN:  PT FREQUENCY: 1-2x/week  PT DURATION: 8 weeks  PLANNED INTERVENTIONS: Therapeutic exercises, Therapeutic activity, Neuro Muscular re-education, Balance training, Gait training, Patient/Family education, Joint mobilization, Stair training, DME instructions, Dry Needling, Electrical stimulation, Cryotherapy, vasopneumatic device, Moist heat, Taping, Traction Ultrasound, Ionotophoresis 4mg /ml Dexamethasone, and Manual therapy.  All included unless contraindicated  PLAN FOR NEXT SESSION: lumbar mobility, mobs if needed, manual as needed, consider DN, hip flexibility, continue working on building core strength    , PT, DPT, OCS, ATC 02/14/22  8:35 AM     Referring diagnosis? M54.41,G89.29 (ICD-10-CM) - Chronic midline low back pain with right-sided sciatica Treatment diagnosis? (if different than referring  diagnosis) M54.42, M62.81, R26.2 What was this (referring dx) caused by? []  Surgery []  Fall [x]  Ongoing issue []  Arthritis []  Other: ____________  Laterality: []  Rt []  Lt []  Both  Check all possible CPT codes:  *CHOOSE 10 OR LESS*    [x]  97110 (Therapeutic Exercise)  []  92507 (SLP Treatment)  [x]  97112 (Neuro Re-ed)   []  92526 (Swallowing Treatment)   [x]  97116 (Gait Training)   []  04/15/22 (Cognitive Training, 1st 15 minutes) [x]  97140 (Manual Therapy)   []  97130 (Cognitive Training, each add'l 15 minutes)  []  97164 (Re-evaluation)                              []  Other, List CPT Code ____________  [x]  97530 (Therapeutic Activities)     [x]  97535 (Self Care)   []  All codes above (97110 - 97535)  [x]  97012 (Mechanical Traction)  [  x] 97014 (E-stim Unattended)  []  97032 (E-stim manual)  []  97033 (Ionto)  [x]  97035 (Ultrasound) []  97750 (Physical Performance Training) []  [id number] (Aquatic Therapy) []  97016 (Vasopneumatic Device) []  [id number] (Paraffin) []  97034 (Contrast Bath) []  97597 (Wound Care 1st 20 sq cm) []  97598 (Wound Care each add'l 20 sq cm) []  97760 (Orthotic Fabrication, Fitting, Training Initial) []  [id number] (Prosthetic Management and Training Initial) []  [id number] (Orthotic or Prosthetic Training/ Modification Subsequent)

## 2022-02-14 NOTE — Telephone Encounter (Signed)
He indicated he wasn't able to make today's visit.  Reminded him of the next visit on Monday   Scot Jun, PT, DPT, OCS, ATC 02/14/22  12:04 PM

## 2022-02-18 ENCOUNTER — Ambulatory Visit (INDEPENDENT_AMBULATORY_CARE_PROVIDER_SITE_OTHER): Payer: Medicare PPO | Admitting: Physical Therapy

## 2022-02-18 ENCOUNTER — Encounter: Payer: Self-pay | Admitting: Physical Therapy

## 2022-02-18 DIAGNOSIS — G8929 Other chronic pain: Secondary | ICD-10-CM | POA: Diagnosis not present

## 2022-02-18 DIAGNOSIS — M5441 Lumbago with sciatica, right side: Secondary | ICD-10-CM

## 2022-02-18 DIAGNOSIS — R262 Difficulty in walking, not elsewhere classified: Secondary | ICD-10-CM | POA: Diagnosis not present

## 2022-02-18 DIAGNOSIS — M6281 Muscle weakness (generalized): Secondary | ICD-10-CM | POA: Diagnosis not present

## 2022-02-18 DIAGNOSIS — M5442 Lumbago with sciatica, left side: Secondary | ICD-10-CM | POA: Diagnosis not present

## 2022-02-18 NOTE — Therapy (Signed)
OUTPATIENT PHYSICAL THERAPY THORACOLUMBAR TREATMENT   Patient Name: Thomas Moody MRN: [id number] DOB:14-Mar-1955, 67 y.o., male Today's Date: 02/18/2022  END OF SESSION:  PT End of Session - 02/18/22 1628     Visit Number 3    Number of Visits 12    Date for PT Re-Evaluation 03/29/22    Authorization Type [employer]    Authorization - Visit Number 3    Authorization - Number of Visits 12    PT Start Time 1100    PT Stop Time 1143    PT Time Calculation (min) 43 min    Activity Tolerance Patient tolerated treatment well    Behavior During Therapy WFL for tasks assessed/performed               Past Medical History:  Diagnosis Date   Asthma    GERD (gastroesophageal reflux disease)    Past Surgical History:  Procedure Laterality Date   PILONIDAL CYST EXCISION     Patient Active Problem List   Diagnosis Date Noted   Hallux rigidus, right foot 09/13/2021   Chronic midline low back pain 07/31/2021   Arthritic-like pain 02/21/2021   Prediabetes 07/29/2020   Hyperlipidemia 07/29/2020   Osteoarthritis of spine with radiculopathy, cervical region 03/22/2016    PCP:  Rema Fendt, NP   REFERRING PROVIDER:  Cristie Hem, PA-C Ref Provider  REFERRING DIAG: [phone] (ICD-10-CM) - Chronic midline low back pain with right-sided sciatica  Rationale for Evaluation and Treatment: Rehabilitation  THERAPY DIAG:  Chronic bilateral low back pain with bilateral sciatica  Muscle weakness (generalized)  Difficulty in walking, not elsewhere classified  ONSET DATE: ongoing for years, gradually worsening over the last few months  SUBJECTIVE:  SUBJECTIVE STATEMENT:  Pt arriving today reporting 2/10 pain in his low back  PERTINENT HISTORY:  Asthma, GERD, h/o low back pain  PAIN:  NPRS scale: 2/10 at rest Pain location: midline low back Pain description: achy Aggravating factors: standing, picking up trash, twisting, reaching  Relieving factors: HEP, prednisone from [place]   PRECAUTIONS: None  WEIGHT BEARING RESTRICTIONS: No  FALLS:  Has patient fallen in last 6 months? No  LIVING ENVIRONMENT: Lives with: lives with their family and lives with their spouse Lives in: House/apartment Stairs: Yes: Internal: 1 flight steps; on right going up Has following equipment at home: None  OCCUPATION: retired from [employer]  PLOF: Independent  PATIENT GOALS: Stop hurting   OBJECTIVE:   DIAGNOSTIC FINDINGS:  IMPRESSION: 08/13/21 1. Severe facet arthrosis at L4-L5 with grade 1 anterolisthesis and mild left neural foraminal stenosis. 2. Mild left L3-4 neural foraminal stenosis with left asymmetric disc bulge and left-greater-than-right facet hypertrophy. Annular fissure in close proximity to the exiting left L3 nerve root could serve as a source of left L3 radiculopathy.  PATIENT SURVEYS:  01/29/22: FOTO eval:    57%   SCREENING FOR RED FLAGS: Bowel or bladder incontinence: No Cauda equina syndrome: No  COGNITION: Overall cognitive status: WFL normal      SENSATION: WFL    POSTURE: rounded shoulders, forward head, and decreased lumbar lordosis  PALPATION: TTP: lumbar paraspinals  LUMBAR ROM:   AROM eval  Flexion 65  Extension 20  Right lateral flexion 26  Left lateral flexion 30  Right rotation WFL   Left rotation WFL   (Blank rows = not tested)  LOWER EXTREMITY ROM:     Active  Right eval Left eval  Hip flexion 105 102  Hip extension    Hip abduction    Hip adduction    Hip internal rotation    Hip external rotation    Knee flexion    Knee  extension     (Blank rows = not tested)  LOWER EXTREMITY MMT:    MMT Right eval Left eval  Hip flexion 5/5 5/5  Hip extension 4/5  4/5  Hip abduction 5/5 5/5  Hip adduction 5/5 5/5  Hip internal rotation    Hip external rotation    Knee flexion    Knee extension     (Blank rows = not tested)  LUMBAR SPECIAL TESTS:  01/29/22: Slump test: NegativeRt and Left negative  FUNCTIONAL TESTS:  01/29/22: 5 times sit to stand: 12 seconds with no UE support  GAIT: Distance walked: 50 feet  Assistive device utilized: None Level of assistance: Complete Independence Comments: WFL  TODAY'S TREATMENT:                                                                                                                              DATE:  02/18/21:  TherEx Rows: level 4 band x 15 holding 3 sec Seated lumbar flexion x 5 holding  10 sec Seated QL stretch over ball x 2 to each side holding 10 seconds Sit to stand: x 10  Opposite arm to leg in sitting c core activation x 10 each Trunk rotation x 3 holding 20 sec each side Piriformis stretch/glute stretch pushing knee away: x 2 each LE holding 20 sec Supine clam shells using level 3 band x 15 holding 3 sec Manual STM to lumbar paraspinals Lumbar grade 2-3 PA mobs L2-L5 Modalities; Moist heat x 5 minutes to lumbar spine  02/13/22 TherEx  SKTC 5x5 second holds B HS stretches 2x30 seconds B Piriforims stretches 2x30 seconds B Lumbar rotation stretches 5x5 seconds B PPT x15 with 3 second holds multimodal cues  Bridges x15 with 2 second holds TA set with march supine x10 B TA set with opposite/alternating UE/LE lift x10 B Seated QL stretch 2x30 seconds Hip hikes x10 B  Hip hikes + ABD x10 B 3D hip excursions x10 each direction   01/29/22:   Therex:    HEP instruction/performance c cues for techniques, handout provided.  Trial set performed of each for comprehension and symptom assessment.  See below for exercise list  PATIENT EDUCATION:   Education details: exercise form and purpose  Person educated: Patient Education method: Explanation, Demonstration, Verbal cues, and Handouts Education comprehension: verbalized understanding, returned demonstration, and verbal cues required  HOME EXERCISE PROGRAM: Access Code: [email or web address] URL: [email or web address] Date: 01/29/2022 Prepared by: Narda Amber  Exercises - Supine Bridge  - 2 x daily - 7 x weekly - 2 sets - 10 reps - 5 seconds hold - Supine Lower Trunk Rotation  - 2 x daily - 7 x weekly - 3 reps - 30 seconds hold - Supine Active Straight Leg Raise  - 2 x daily - 7 x weekly - 2 sets - 10 reps - Hooklying Single Knee to Chest Stretch  - 2 x daily - 7 x weekly - 3 reps - 30 seconds hold - Standing Lumbar Extension at Wall - Forearms  - 2 x daily - 7 x weekly - 10 reps - 5 seconds hold  ASSESSMENT:  CLINICAL IMPRESSION: Pt arriving today with 2/10 pain in his low back. Pt stating he has been compliant in his HEP when he can. Pt tolerating exercises well. Pt still presenting with difficulty with trunk dissociation and weakness in his core. Pt with good response to lumbar mobs this visit. Recommended continued skilled PT to maximize pt's function.   OBJECTIVE IMPAIRMENTS: decreased mobility, difficulty walking, decreased ROM, decreased strength, and pain.   ACTIVITY LIMITATIONS: lifting, bending, sitting, standing, squatting, stairs, and reach over head  PARTICIPATION LIMITATIONS: cleaning, driving, community activity, and yard work  PERSONAL FACTORS: 1-2 comorbidities: see above pertinent history  are also affecting patient's functional outcome.   REHAB POTENTIAL: Good  CLINICAL DECISION MAKING: Stable/uncomplicated  EVALUATION COMPLEXITY: Low   GOALS: Goals reviewed with patient? Yes  SHORT TERM GOALS: (target date for Short term goals are 3 weeks  02/24/22)  1. Patient will demonstrate independent use of home exercise program to maintain progress  from in clinic treatments.  Goal status: on-going 02/18/22  LONG TERM GOALS: (target dates for all long term goals are 8 weeks  03/29/21 )   1. Patient will demonstrate/report pain at worst less than or equal to 2/10 to facilitate minimal limitation in daily activity secondary to pain symptoms.  Goal status: New   2. Patient will demonstrate independent use of home exercise program to facilitate ability to maintain/progress functional  gains from skilled physical therapy services.  Goal status: New   3. Patient will demonstrate FOTO outcome > or = 64 % to indicate reduced disability due to condition.  Goal status: New   4. Patient will demonstrate lumbar extension  >/= 30 degrees with no symptoms to facilitate upright standing, walking posture at PLOF s limitation.  Goal status: New   5.  Pt will be able to lift 20# from floor to overhead shelf with correct body mechanics with no pain reported.   Goal status: New   6.  Pt will be able to report standing for 30 minutes with pain </= 2/10.  Goal status: New     PLAN:  PT FREQUENCY: 1-2x/week  PT DURATION: 8 weeks  PLANNED INTERVENTIONS: Therapeutic exercises, Therapeutic activity, Neuro Muscular re-education, Balance training, Gait training, Patient/Family education, Joint mobilization, Stair training, DME instructions, Dry Needling, Electrical stimulation, Cryotherapy, vasopneumatic device, Moist heat, Taping, Traction Ultrasound, Ionotophoresis 4mg /ml Dexamethasone, and Manual therapy.  All included unless contraindicated  PLAN FOR NEXT SESSION: lumbar mobility, mobs if needed, manual as needed, consider DN, hip flexibility, continue working on building core strength       , PT, MPT 02/18/22 4:30 PM

## 2022-02-20 ENCOUNTER — Encounter: Payer: Self-pay | Admitting: Physical Therapy

## 2022-02-20 ENCOUNTER — Ambulatory Visit (INDEPENDENT_AMBULATORY_CARE_PROVIDER_SITE_OTHER): Payer: Medicare PPO | Admitting: Physical Therapy

## 2022-02-20 DIAGNOSIS — G8929 Other chronic pain: Secondary | ICD-10-CM

## 2022-02-20 DIAGNOSIS — R252 Cramp and spasm: Secondary | ICD-10-CM | POA: Diagnosis not present

## 2022-02-20 DIAGNOSIS — M5441 Lumbago with sciatica, right side: Secondary | ICD-10-CM | POA: Diagnosis not present

## 2022-02-20 DIAGNOSIS — R262 Difficulty in walking, not elsewhere classified: Secondary | ICD-10-CM | POA: Diagnosis not present

## 2022-02-20 DIAGNOSIS — M6281 Muscle weakness (generalized): Secondary | ICD-10-CM | POA: Diagnosis not present

## 2022-02-20 DIAGNOSIS — M5442 Lumbago with sciatica, left side: Secondary | ICD-10-CM | POA: Diagnosis not present

## 2022-02-20 NOTE — Therapy (Signed)
OUTPATIENT PHYSICAL THERAPY THORACOLUMBAR TREATMENT   Patient Name: Thomas Moody MRN: [id number] DOB:03-Sep-1955, 67 y.o., male Today's Date: 02/20/2022  END OF SESSION:  PT End of Session - 02/20/22 1026     Visit Number 4    Number of Visits 12    Date for PT Re-Evaluation 03/29/22    Authorization Type [employer]    Authorization - Visit Number 4    Authorization - Number of Visits 12    PT Start Time 1015    PT Stop Time 1055    PT Time Calculation (min) 40 min    Activity Tolerance Patient tolerated treatment well    Behavior During Therapy WFL for tasks assessed/performed               Past Medical History:  Diagnosis Date   Asthma    GERD (gastroesophageal reflux disease)    Past Surgical History:  Procedure Laterality Date   PILONIDAL CYST EXCISION     Patient Active Problem List   Diagnosis Date Noted   Hallux rigidus, right foot 09/13/2021   Chronic midline low back pain 07/31/2021   Arthritic-like pain 02/21/2021   Prediabetes 07/29/2020   Hyperlipidemia 07/29/2020   Osteoarthritis of spine with radiculopathy, cervical region 03/22/2016    PCP:  Rema Fendt, NP   REFERRING PROVIDER:  Cristie Hem, PA-C Ref Provider  REFERRING DIAG: [phone] (ICD-10-CM) - Chronic midline low back pain with right-sided sciatica  Rationale for Evaluation and Treatment: Rehabilitation  THERAPY DIAG:  Chronic bilateral low back pain with bilateral sciatica  Muscle weakness (generalized)  Cramp and spasm  Difficulty in walking, not elsewhere classified  ONSET DATE: ongoing for years, gradually worsening over the last few months  SUBJECTIVE:  SUBJECTIVE STATEMENT: Pt arriving today reporting 1/10 pain in his back. Pt stating that some of the light lifting he did around his home didn't seem to hurt like normal.   PERTINENT HISTORY:  Asthma, GERD, h/o low back pain  PAIN:  NPRS scale: 1/10 at rest Pain location: midline low back Pain description: achy Aggravating factors: standing, picking up trash, twisting, reaching  Relieving factors: HEP, prednisone from [place]   PRECAUTIONS: None  WEIGHT BEARING RESTRICTIONS: No  FALLS:  Has patient fallen in last 6 months? No  LIVING ENVIRONMENT: Lives with: lives with their family and lives with their spouse Lives in: House/apartment Stairs: Yes: Internal: 1 flight steps; on right going up Has following equipment at home: None  OCCUPATION: retired from [employer]  PLOF: Independent  PATIENT GOALS: Stop hurting   OBJECTIVE:   DIAGNOSTIC FINDINGS:  IMPRESSION: 08/13/21 1. Severe facet arthrosis at L4-L5 with grade 1 anterolisthesis and mild left neural foraminal stenosis. 2. Mild left L3-4 neural foraminal stenosis with left asymmetric disc bulge and left-greater-than-right facet hypertrophy. Annular fissure in close proximity to the exiting left L3 nerve root could serve as a source of left L3 radiculopathy.  PATIENT SURVEYS:  01/29/22: FOTO eval:    57%   SCREENING FOR RED FLAGS: Bowel or bladder incontinence: No Cauda equina syndrome: No  COGNITION: Overall cognitive status: WFL normal      SENSATION: WFL    POSTURE: rounded shoulders, forward head, and decreased lumbar lordosis  PALPATION: TTP: lumbar paraspinals  LUMBAR ROM:   AROM eval  Flexion 65  Extension 20  Right lateral flexion 26  Left lateral flexion 30  Right rotation WFL   Left rotation WFL   (Blank rows = not tested)  LOWER EXTREMITY ROM:     Active  Right eval Left eval  Hip flexion 105 102  Hip extension    Hip  abduction    Hip adduction    Hip internal rotation    Hip external rotation    Knee flexion    Knee extension     (Blank rows = not tested)  LOWER EXTREMITY MMT:    MMT Right eval Left eval  Hip flexion 5/5 5/5  Hip extension 4/5  4/5  Hip abduction 5/5 5/5  Hip adduction 5/5 5/5  Hip internal rotation    Hip external rotation    Knee flexion    Knee extension     (Blank rows = not tested)  LUMBAR SPECIAL TESTS:  01/29/22: Slump test: NegativeRt and Left negative  FUNCTIONAL TESTS:  01/29/22: 5 times sit to stand: 12 seconds with no UE support  GAIT: Distance walked: 50 feet  Assistive device utilized: None Level of assistance: Complete Independence Comments: WFL  TODAY'S TREATMENT:                                                                                                                              DATE:  02/20/21:  TherEx BATCA: rows: 20# 2 x 15  BATCA: lat pull downs 15# 2 x 15 Standing using ball on table top  lumbar flexion x 5 holding 10 sec Standing QL stretch over ball x 2 to each side holding 5 seconds Mini squats in parallel bars c bil UE support Standing hip abduction x 15 each LE c UE support Trunk rotation x 3 holding 20 sec each side Piriformis stretch/glute stretch pushing knee away: x 2 each LE holding 20 sec SKTC: x 2 each LE holding 10 seconds Manual STM to lumbar paraspinals using Biofreeze Lumbar grade 2-3 PA mobs L2-L5 Moist heat rotated throughout thoracic and lumbar spines during manual therapy   02/18/21:  TherEx Rows: level 4 band x 15 holding 3 sec Seated lumbar flexion x 5 holding 10 sec Seated QL stretch over ball x 2 to each side holding 10 seconds Sit to stand: x 10  Opposite arm to leg in sitting c core activation x 10 each Trunk rotation x 3 holding 20 sec each side Piriformis stretch/glute stretch pushing knee away: x 2 each LE holding 20 sec Supine clam shells using level 3 band x 15 holding 3 sec Manual STM to  lumbar paraspinals Lumbar grade 2-3 PA mobs L2-L5 Modalities; Moist heat x 5 minutes to lumbar spine   02/13/22 TherEx  SKTC 5x5 second holds B HS stretches 2x30 seconds B Piriforims stretches 2x30 seconds B Lumbar rotation stretches 5x5 seconds B PPT x15 with 3 second holds multimodal cues  Bridges x15 with 2 second holds TA set with march supine x10 B TA set with opposite/alternating UE/LE lift x10 B Seated QL stretch 2x30 seconds Hip hikes x10 B  Hip hikes + ABD x10 B 3D hip excursions x10 each direction     PATIENT EDUCATION:  Education details: exercise form and purpose  Person educated: Patient Education method: Consulting civil engineer, Media planner, Verbal cues, and Handouts Education comprehension: verbalized understanding, returned demonstration, and verbal cues required  HOME EXERCISE PROGRAM: Access Code: [email or web address] URL: [email or web address] Date: 01/29/2022 Prepared by: Kearney Hard  Exercises - Supine Bridge  - 2 x daily - 7 x weekly - 2 sets - 10 reps - 5 seconds hold - Supine Lower Trunk Rotation  - 2 x daily - 7 x weekly - 3 reps - 30 seconds hold - Supine Active Straight Leg Raise  - 2 x daily - 7 x weekly - 2 sets - 10 reps - Hooklying Single Knee to Chest Stretch  - 2 x daily - 7 x weekly - 3 reps - 30 seconds hold - Standing Lumbar Extension at Wall - Forearms  - 2 x daily - 7 x weekly - 10 reps - 5 seconds hold  ASSESSMENT:  CLINICAL IMPRESSION: Pt arriving today with 1/10 pain in his low back. Pt reporting good response to his last therapy session with less pain reported with lifting at home. Pt tolerating core strengthening and mobility exercises well today followed by manual therapy. Continue skilled PT interventions to progress pt toward his LTG's.   OBJECTIVE IMPAIRMENTS: decreased mobility, difficulty walking, decreased ROM, decreased strength, and pain.   ACTIVITY LIMITATIONS: lifting, bending, sitting, standing, squatting, stairs,  and reach over head  PARTICIPATION LIMITATIONS: cleaning, driving, community activity, and yard work  PERSONAL FACTORS: 1-2 comorbidities: see above pertinent history  are also affecting patient's functional outcome.   REHAB POTENTIAL: Good  CLINICAL DECISION MAKING: Stable/uncomplicated  EVALUATION COMPLEXITY: Low   GOALS: Goals reviewed with patient? Yes  SHORT TERM GOALS: (target date for Short term goals are 3 weeks  02/24/22)  1. Patient will demonstrate independent use of home exercise program to maintain progress from in clinic treatments.  Goal status: on-going 02/18/22  LONG TERM GOALS: (target dates for all long term goals are 8 weeks  03/29/21 )   1. Patient will demonstrate/report pain at worst less than or equal to 2/10 to facilitate minimal limitation in daily activity secondary to pain symptoms.  Goal status: New   2. Patient will demonstrate independent use of home exercise program to facilitate ability to maintain/progress functional gains from skilled physical therapy services.  Goal status: New   3. Patient will demonstrate FOTO outcome > or = 64 % to indicate reduced disability due to condition.  Goal status: New   4. Patient will demonstrate lumbar extension  >/= 30 degrees with no symptoms to facilitate upright standing, walking posture at PLOF s limitation.  Goal status: New   5.  Pt will be able to lift 20# from floor to overhead shelf with correct body mechanics with no pain reported.   Goal status: New   6.  Pt will be able to report standing for 30 minutes with pain </= 2/10.  Goal status: New     PLAN:  PT FREQUENCY: 1-2x/week  PT DURATION: 8 weeks  PLANNED INTERVENTIONS: Therapeutic exercises, Therapeutic activity, Neuro Muscular re-education, Balance training, Gait training, Patient/Family education, Joint mobilization, Stair training, DME instructions, Dry Needling, Electrical stimulation, Cryotherapy, vasopneumatic device, Moist heat,  Taping, Traction Ultrasound, Ionotophoresis 4mg /ml Dexamethasone, and Manual therapy.  All included unless contraindicated  PLAN FOR NEXT SESSION: lumbar mobility, mobs if needed, manual as needed, consider DN, hip flexibility, continue working on building core strength       Kearney Hard, PT, MPT 02/20/22 10:49 AM

## 2022-02-25 ENCOUNTER — Ambulatory Visit (INDEPENDENT_AMBULATORY_CARE_PROVIDER_SITE_OTHER): Payer: Medicare PPO | Admitting: Physical Therapy

## 2022-02-25 ENCOUNTER — Encounter: Payer: Self-pay | Admitting: Physical Therapy

## 2022-02-25 DIAGNOSIS — R252 Cramp and spasm: Secondary | ICD-10-CM | POA: Diagnosis not present

## 2022-02-25 DIAGNOSIS — M6281 Muscle weakness (generalized): Secondary | ICD-10-CM | POA: Diagnosis not present

## 2022-02-25 DIAGNOSIS — M5441 Lumbago with sciatica, right side: Secondary | ICD-10-CM

## 2022-02-25 DIAGNOSIS — G8929 Other chronic pain: Secondary | ICD-10-CM

## 2022-02-25 DIAGNOSIS — R262 Difficulty in walking, not elsewhere classified: Secondary | ICD-10-CM

## 2022-02-25 DIAGNOSIS — M5442 Lumbago with sciatica, left side: Secondary | ICD-10-CM

## 2022-02-25 NOTE — Therapy (Signed)
OUTPATIENT PHYSICAL THERAPY THORACOLUMBAR TREATMENT   Patient Name: Thomas Moody MRN: [id number] DOB:27-Dec-1955, 67 y.o., male Today's Date: 02/25/2022  END OF SESSION:  PT End of Session - 02/25/22 1049     Visit Number 5    Number of Visits 12    Date for PT Re-Evaluation 03/29/22    Authorization Type [employer]    Authorization - Visit Number 5    Authorization - Number of Visits 12    PT Start Time 1019    PT Stop Time 1100    PT Time Calculation (min) 41 min    Activity Tolerance Patient tolerated treatment well    Behavior During Therapy WFL for tasks assessed/performed                Past Medical History:  Diagnosis Date   Asthma    GERD (gastroesophageal reflux disease)    Past Surgical History:  Procedure Laterality Date   PILONIDAL CYST EXCISION     Patient Active Problem List   Diagnosis Date Noted   Hallux rigidus, right foot 09/13/2021   Chronic midline low back pain 07/31/2021   Arthritic-like pain 02/21/2021   Prediabetes 07/29/2020   Hyperlipidemia 07/29/2020   Osteoarthritis of spine with radiculopathy, cervical region 03/22/2016    PCP:  Camillia Herter, NP   REFERRING PROVIDER:  Aundra Dubin, PA-C Ref Provider  REFERRING DIAG: [phone] (ICD-10-CM) - Chronic midline low back pain with right-sided sciatica  Rationale for Evaluation and Treatment: Rehabilitation  THERAPY DIAG:  Chronic bilateral low back pain with bilateral sciatica  Muscle weakness (generalized)  Cramp and spasm  Difficulty in walking, not elsewhere classified  ONSET DATE: ongoing for years, gradually worsening over the last few months  SUBJECTIVE STATEMENT: Pt arriving today reporting 2-3/10 pain in his low back.   PERTINENT HISTORY:  Asthma, GERD, h/o low back pain  PAIN:  NPRS scale: 2-3/10 at rest, more pain with certain activites Pain location: midline low back Pain description: achy Aggravating factors: standing, picking up trash, twisting, reaching  Relieving factors: HEP, prednisone from [place]   PRECAUTIONS: None  WEIGHT BEARING RESTRICTIONS: No  FALLS:  Has patient fallen in last 6 months? No  LIVING ENVIRONMENT: Lives with: lives with their family and lives with their spouse Lives in: House/apartment Stairs: Yes: Internal: 1 flight steps; on right going up Has following equipment at home: None  OCCUPATION: retired from [employer]  PLOF: Independent  PATIENT GOALS: Stop hurting   OBJECTIVE:   DIAGNOSTIC FINDINGS:  IMPRESSION: 08/13/21 1. Severe facet arthrosis at L4-L5 with grade 1 anterolisthesis and mild left neural foraminal stenosis. 2. Mild left L3-4 neural foraminal stenosis with left asymmetric disc bulge and left-greater-than-right facet hypertrophy. Annular fissure in close proximity to the exiting left L3 nerve root could serve as a source of left L3 radiculopathy.  PATIENT SURVEYS:  01/29/22: FOTO eval:    57%   SCREENING FOR RED FLAGS: Bowel or bladder incontinence: No Cauda equina syndrome: No  COGNITION: Overall cognitive status: WFL normal      SENSATION: WFL    POSTURE: rounded shoulders, forward head, and decreased lumbar lordosis  PALPATION: TTP: lumbar paraspinals  LUMBAR ROM:   AROM eval 02/25/22  Flexion 65 68  Extension 20 24  Right lateral flexion 26 30  Left lateral flexion 30 32  Right rotation WFL    Left rotation WFL    (Blank rows = not tested)  LOWER EXTREMITY ROM:     Active  Right eval Left eval  Hip flexion 105 102  Hip extension    Hip abduction    Hip adduction    Hip internal rotation     Hip external rotation    Knee flexion    Knee extension     (Blank rows = not tested)  LOWER EXTREMITY MMT:    MMT Right eval Left eval  Hip flexion 5/5 5/5  Hip extension 4/5  4/5  Hip abduction 5/5 5/5  Hip adduction 5/5 5/5  Hip internal rotation    Hip external rotation    Knee flexion    Knee extension     (Blank rows = not tested)  LUMBAR SPECIAL TESTS:  01/29/22: Slump test: NegativeRt and Left negative  FUNCTIONAL TESTS:  01/29/22: 5 times sit to stand: 12 seconds with no UE support  GAIT: Distance walked: 50 feet  Assistive device utilized: None Level of assistance: Complete Independence Comments: WFL  TODAY'S TREATMENT:                                                                                                                              DATE:  02/25/21:  TherEx BATCA: rows: 20#  2 x 15  BATCA: lat pull downs 15# 2 x 15 Standing QL stretch in door frame: x 2 each side holding 20 seconds Leg Press: 81# 3 x 10  Standing hip extension: x 15 each LE c UE support Piriformis stretch: x 3 on each side, holding 20 sec  Supine Trunk rotation x 3 holding 20 sec each side Dead bug: x 10 Manual STM for skilled palpation during TPDN for active trigger points Trigger Point Dry-Needling  Treatment instructions: Expect mild to moderate muscle soreness. S/S of pneumothorax if dry needled over a lung field, and to seek immediate medical attention should they occur. Patient verbalized understanding of these instructions and education.  Patient Consent Given: Yes Education handout provided: Previously provided Muscles treated: bilateral lumbar multifidi Treatment response/outcome: twitch response noted, with less stiffness reported by pt following treatment Modalities:  Moist heat to lumbar spine x 4 minutes following needling   02/20/21:  TherEx BATCA: rows: 20# 2 x 15  BATCA: lat pull downs 15# 2 x 15 Standing using ball on table top  lumbar flexion x 5  holding 10 sec Standing QL stretch over ball x 2 to each side holding 5 seconds Mini squats in parallel bars c bil UE support Standing hip abduction x 15 each LE c UE support Trunk rotation x 3 holding 20 sec each side Piriformis stretch/glute stretch pushing knee away: x 2 each LE holding 20 sec SKTC: x 2 each LE holding 10 seconds Manual STM to lumbar paraspinals using Biofreeze Lumbar grade 2-3 PA mobs L2-L5 Moist heat rotated throughout thoracic and lumbar spines during manual therapy   02/18/21:  TherEx Rows: level 4 band x 15 holding 3 sec Seated lumbar flexion x 5 holding 10 sec Seated QL stretch over ball x 2 to each side holding 10 seconds Sit to stand: x 10  Opposite arm to leg in sitting c core activation x 10 each Trunk rotation x 3 holding 20 sec each side Piriformis stretch/glute stretch pushing knee away: x 2 each LE holding 20 sec Supine clam shells using level 3 band x 15 holding 3 sec Manual STM to lumbar paraspinals Lumbar grade 2-3 PA mobs L2-L5 Modalities; Moist heat x 5 minutes to lumbar spine        PATIENT EDUCATION:  Education details: exercise form and purpose  Person educated: Patient Education method: Consulting civil engineer, Demonstration, Verbal cues, and Handouts Education comprehension: verbalized understanding, returned demonstration, and verbal cues required  HOME EXERCISE PROGRAM: Access Code: [email or web address] URL: [email or web address] Date: 02/25/2022 Prepared by: Kearney Hard  Exercises - Supine Bridge  - 2 x daily - 7 x weekly - 2 sets - 10 reps - 5 seconds hold - Supine Lower Trunk Rotation  - 2 x daily - 7 x weekly - 3 reps - 30 seconds hold - Supine Active Straight Leg Raise  - 2 x daily - 7 x weekly - 2 sets - 10 reps - Hooklying Single Knee to Chest Stretch  - 2 x daily - 7 x weekly - 3 reps - 30 seconds hold - Standing Lumbar Extension at Hill  - 2 x daily - 7 x weekly - 10 reps - 5 seconds hold - Standing  Shoulder Row with Anchored Resistance  - 2 x daily - 7 x weekly - 3 sets - 10 reps - Dead Bug  - 1 x daily - 7 x weekly - 10 reps  ASSESSMENT:  CLINICAL IMPRESSION: Pt arriving today reporting 2-3/10 pain  in his low back. Pt reporting compliance in his HEP. DN performed to lumbar multifidi this visit with multiple twitch responses noted. Continue to progress to maximize pt's function.   OBJECTIVE IMPAIRMENTS: decreased mobility, difficulty walking, decreased ROM, decreased strength, and pain.   ACTIVITY LIMITATIONS: lifting, bending, sitting, standing, squatting, stairs, and reach over head  PARTICIPATION LIMITATIONS: cleaning, driving, community activity, and yard work  PERSONAL FACTORS: 1-2 comorbidities: see above pertinent history  are also affecting patient's functional outcome.   REHAB POTENTIAL: Good  CLINICAL DECISION MAKING: Stable/uncomplicated  EVALUATION COMPLEXITY: Low   GOALS: Goals reviewed with patient? Yes  SHORT TERM GOALS: (target date for Short term goals are 3 weeks  02/24/22)  1. Patient will demonstrate independent use of home exercise program to maintain progress from in clinic treatments.  Goal status: MET 02/25/22  LONG TERM GOALS: (target dates for all long term goals are 8 weeks  03/29/21 )   1. Patient will demonstrate/report pain at worst less than or equal to 2/10 to facilitate minimal limitation in daily activity secondary to pain symptoms.  Goal status: New   2. Patient will demonstrate independent use of home exercise program to facilitate ability to maintain/progress functional gains from skilled physical therapy services.  Goal status: New   3. Patient will demonstrate FOTO outcome > or = 64 % to indicate reduced disability due to condition.  Goal status: New   4. Patient will demonstrate lumbar extension  >/= 30 degrees with no symptoms to facilitate upright standing, walking posture at PLOF s limitation.  Goal status: New   5.  Pt  will be able to lift 20# from floor to overhead shelf with correct body mechanics with no pain reported.   Goal status: New   6.  Pt will be able to report standing for 30 minutes with pain </= 2/10.  Goal status: New     PLAN:  PT FREQUENCY: 1-2x/week  PT DURATION: 8 weeks  PLANNED INTERVENTIONS: Therapeutic exercises, Therapeutic activity, Neuro Muscular re-education, Balance training, Gait training, Patient/Family education, Joint mobilization, Stair training, DME instructions, Dry Needling, Electrical stimulation, Cryotherapy, vasopneumatic device, Moist heat, Taping, Traction Ultrasound, Ionotophoresis 4mg /ml Dexamethasone, and Manual therapy.  All included unless contraindicated  PLAN FOR NEXT SESSION: assess response to DN, lumbar mobility, mobs if needed, manual as needed,  hip flexibility, continue working on building core strength       , PT, MPT 02/25/22 10:58 AM

## 2022-02-27 ENCOUNTER — Encounter: Payer: Self-pay | Admitting: Physical Therapy

## 2022-02-27 ENCOUNTER — Ambulatory Visit (INDEPENDENT_AMBULATORY_CARE_PROVIDER_SITE_OTHER): Payer: Medicare PPO | Admitting: Physical Therapy

## 2022-02-27 DIAGNOSIS — R262 Difficulty in walking, not elsewhere classified: Secondary | ICD-10-CM | POA: Diagnosis not present

## 2022-02-27 DIAGNOSIS — R252 Cramp and spasm: Secondary | ICD-10-CM

## 2022-02-27 DIAGNOSIS — M5441 Lumbago with sciatica, right side: Secondary | ICD-10-CM | POA: Diagnosis not present

## 2022-02-27 DIAGNOSIS — M5442 Lumbago with sciatica, left side: Secondary | ICD-10-CM

## 2022-02-27 DIAGNOSIS — M6281 Muscle weakness (generalized): Secondary | ICD-10-CM | POA: Diagnosis not present

## 2022-02-27 DIAGNOSIS — G8929 Other chronic pain: Secondary | ICD-10-CM

## 2022-02-27 NOTE — Therapy (Addendum)
OUTPATIENT PHYSICAL THERAPY THORACOLUMBAR TREATMENT DISCHARGE NOTE   Patient Name: Thomas Moody MRN: [id number] DOB:26-Oct-1955, 67 y.o., male Today's Date: 02/27/2022  END OF SESSION:  PT End of Session - 02/27/22 0942     Visit Number 6    Number of Visits 12    Date for PT Re-Evaluation 03/29/22    Authorization Type [employer]    Authorization - Visit Number 6    Authorization - Number of Visits 12    PT Start Time 0940    PT Stop Time 1015    PT Time Calculation (min) 35 min    Activity Tolerance Patient tolerated treatment well    Behavior During Therapy [facility] for tasks assessed/performed                Past Medical History:  Diagnosis Date   Asthma    GERD (gastroesophageal reflux disease)    Past Surgical History:  Procedure Laterality Date   PILONIDAL CYST EXCISION     Patient Active Problem List   Diagnosis Date Noted   Hallux rigidus, right foot 09/13/2021   Chronic midline low back pain 07/31/2021   Arthritic-like pain 02/21/2021   Prediabetes 07/29/2020   Hyperlipidemia 07/29/2020   Osteoarthritis of spine with radiculopathy, cervical region 03/22/2016    PCP:  Camillia Herter, NP   REFERRING PROVIDER:  Aundra Dubin, PA-C Ref Provider  REFERRING DIAG: [phone] (ICD-10-CM) - Chronic midline low back pain with right-sided sciatica  Rationale for Evaluation and Treatment: Rehabilitation  THERAPY DIAG:  Chronic bilateral low back pain with bilateral sciatica  Muscle weakness (generalized)  Cramp and spasm  Difficulty in walking, not elsewhere classified  ONSET DATE: ongoing for years, gradually worsening over the last few months  SUBJECTIVE STATEMENT: Pt stating he has been doing his exercises at home and is beginning to increase the number of reps. Pt reporting 3/10 pain in his low back.   PERTINENT HISTORY:  Asthma, GERD, h/o low back pain  PAIN:  NPRS scale: 2-3/10 at rest, more pain with certain activites Pain location: midline low back Pain description: achy Aggravating factors: standing, picking up trash, twisting, reaching  Relieving factors: HEP, prednisone from MD   PRECAUTIONS: None  WEIGHT BEARING RESTRICTIONS: No  FALLS:  Has patient fallen in last 6 months? No  LIVING ENVIRONMENT: Lives with: lives with their family and lives with their spouse Lives in: House/apartment Stairs: Yes: Internal: 1 flight steps; on right going up Has following equipment at home: None  OCCUPATION: retired from [employer]  PLOF: Independent  PATIENT GOALS: Stop hurting   OBJECTIVE:   DIAGNOSTIC FINDINGS:  IMPRESSION: 08/13/21 1. Severe facet arthrosis at L4-L5 with grade 1 anterolisthesis and mild left neural foraminal stenosis. 2. Mild left L3-4 neural foraminal stenosis with left asymmetric disc bulge and left-greater-than-right facet hypertrophy. Annular fissure in close proximity to the exiting left L3 nerve root could serve as a source of left L3 radiculopathy.  PATIENT SURVEYS:  01/29/22: FOTO eval:    57%   SCREENING FOR RED FLAGS: Bowel or bladder incontinence: No Cauda equina syndrome: No  COGNITION: Overall cognitive status: WFL normal      SENSATION: WFL    POSTURE: rounded shoulders, forward head, and decreased lumbar lordosis  PALPATION: TTP: lumbar paraspinals  LUMBAR ROM:   AROM eval 02/25/22  Flexion 65 68  Extension 20 24  Right lateral flexion 26 30  Left lateral flexion 30 32  Right rotation WFL    Left rotation WFL    (Blank rows = not tested)  LOWER EXTREMITY ROM:     Active  Right eval Left eval   Hip flexion 105 102  Hip extension    Hip abduction    Hip adduction    Hip internal rotation    Hip external rotation    Knee flexion    Knee extension     (Blank rows = not tested)  LOWER EXTREMITY MMT:    MMT Right eval Left eval  Hip flexion 5/5 5/5  Hip extension 4/5  4/5  Hip abduction 5/5 5/5  Hip adduction 5/5 5/5  Hip internal rotation    Hip external rotation    Knee flexion    Knee extension     (Blank rows = not tested)  LUMBAR SPECIAL TESTS:  01/29/22: Slump test: NegativeRt and Left negative  FUNCTIONAL TESTS:  01/29/22: 5 times sit to stand: 12 seconds with no UE support  GAIT: Distance walked: 50 feet  Assistive device utilized: None Level of assistance: Complete Independence Comments: WFL  TODAY'S TREATMENT:  DATE:  02/27/21:  TherEx Nustep: Level 6 x 6 minutes BATCA: 5# each UE, shoulder extension x 20  BATCA: rows: 25# 2 x 15  BATCA: lat pull downs 15# 2 x 15 Leg Press: 87# bil LE's x 20  Manual STM for skilled palpation during TPDN for active trigger points Trigger Point Dry-Needling  Treatment instructions: Expect mild to moderate muscle soreness. S/S of pneumothorax if dry needled over a lung field, and to seek immediate medical attention should they occur. Patient verbalized understanding of these instructions and education.  Patient Consent Given: Yes Education handout provided: Previously provided Muscles treated: bilateral lumbar multifidi Rt glute medius  Treatment response/outcome: twitch response noted, with less stiffness reported by pt following treatment Modalities:  Moist heat to lumbar spine x 4 minutes following needling   02/25/21:  TherEx BATCA: rows: 20# 2 x 15  BATCA: lat pull downs 15# 2 x 15 Standing QL stretch in door frame: x 2 each side holding 20 seconds Leg Press: 81# 3 x 10  Standing  hip extension: x 15 each LE c UE support Piriformis stretch: x 3 on each side, holding 20 sec  Supine Trunk rotation x 3 holding 20 sec each side Dead bug: x 10 Manual STM for skilled palpation during TPDN for active trigger points Trigger Point Dry-Needling  Treatment instructions: Expect mild to moderate muscle soreness. S/S of pneumothorax if dry needled over a lung field, and to seek immediate medical attention should they occur. Patient verbalized understanding of these instructions and education.  Patient Consent Given: Yes Education handout provided: Previously provided Muscles treated: bilateral lumbar multifidi Treatment response/outcome: twitch response noted, with less stiffness reported by pt following treatment Modalities:  Moist heat to lumbar spine x 4 minutes following needling   02/20/21:  TherEx BATCA: rows: 20# 2 x 15  BATCA: lat pull downs 15# 2 x 15 Standing using ball on table top  lumbar flexion x 5 holding 10 sec Standing QL stretch over ball x 2 to each side holding 5 seconds Mini squats in parallel bars c bil UE support Standing hip abduction x 15 each LE c UE support Trunk rotation x 3 holding 20 sec each side Piriformis stretch/glute stretch pushing knee away: x 2 each LE holding 20 sec SKTC: x 2 each LE holding 10 seconds Manual STM to lumbar paraspinals using Biofreeze Lumbar grade 2-3 PA mobs L2-L5 Moist heat rotated throughout thoracic and lumbar spines during manual therapy          PATIENT EDUCATION:  Education details: exercise form and purpose  Person educated: Patient Education method: Consulting civil engineer, Media planner, Verbal cues, and Handouts Education comprehension: verbalized understanding, returned demonstration, and verbal cues required  HOME EXERCISE PROGRAM: Access Code: [email or web address] URL: [email or web address] Date: 02/25/2022 Prepared by: Kearney Hard  Exercises - Supine Bridge  - 2 x daily - 7 x weekly - 2 sets  - 10 reps - 5 seconds hold - Supine Lower Trunk Rotation  - 2 x daily - 7 x weekly - 3 reps - 30 seconds hold - Supine Active Straight Leg Raise  - 2 x daily - 7 x weekly - 2 sets - 10 reps - Hooklying Single Knee to Chest Stretch  - 2 x daily - 7 x weekly - 3 reps - 30 seconds hold - Standing Lumbar Extension at Wall - Forearms  - 2 x daily - 7 x weekly - 10 reps - 5 seconds hold - Standing Shoulder Row with Anchored Resistance  -  2 x daily - 7 x weekly - 3 sets - 10 reps - Dead Bug  - 1 x daily - 7 x weekly - 10 reps  ASSESSMENT:  CLINICAL IMPRESSION: Pt arriving today 10 minutes late for session. Pt reporting pain of 3/10 in his low back.  Pt feels the DN is helping his back pain and pt reporting overall decrease in tightness. Continue to progress to maximize pt's function.   OBJECTIVE IMPAIRMENTS: decreased mobility, difficulty walking, decreased ROM, decreased strength, and pain.   ACTIVITY LIMITATIONS: lifting, bending, sitting, standing, squatting, stairs, and reach over head  PARTICIPATION LIMITATIONS: cleaning, driving, community activity, and yard work  PERSONAL FACTORS: 1-2 comorbidities: see above pertinent history  are also affecting patient's functional outcome.   REHAB POTENTIAL: Good  CLINICAL DECISION MAKING: Stable/uncomplicated  EVALUATION COMPLEXITY: Low   GOALS: Goals reviewed with patient? Yes  SHORT TERM GOALS: (target date for Short term goals are 3 weeks  02/24/22)  1. Patient will demonstrate independent use of home exercise program to maintain progress from in clinic treatments.  Goal status: MET 02/25/22  LONG TERM GOALS: (target dates for all long term goals are 8 weeks  03/29/21 )   1. Patient will demonstrate/report pain at worst less than or equal to 2/10 to facilitate minimal limitation in daily activity secondary to pain symptoms.  Goal status: on-going 02/27/22   2. Patient will demonstrate independent use of home exercise program to  facilitate ability to maintain/progress functional gains from skilled physical therapy services.  Goal status: on-going 02/27/22   3. Patient will demonstrate FOTO outcome > or = 64 % to indicate reduced disability due to condition.  Goal status: New   4. Patient will demonstrate lumbar extension  >/= 30 degrees with no symptoms to facilitate upright standing, walking posture at PLOF s limitation.  Goal status: New   5.  Pt will be able to lift 20# from floor to overhead shelf with correct body mechanics with no pain reported.   Goal status: New   6.  Pt will be able to report standing for 30 minutes with pain </= 2/10.  Goal status: on-going 02/27/22     PLAN:  PT FREQUENCY: 1-2x/week  PT DURATION: 8 weeks  PLANNED INTERVENTIONS: Therapeutic exercises, Therapeutic activity, Neuro Muscular re-education, Balance training, Gait training, Patient/Family education, Joint mobilization, Stair training, DME instructions, Dry Needling, Electrical stimulation, Cryotherapy, vasopneumatic device, Moist heat, Taping, Traction Ultrasound, Ionotophoresis 30m/ml Dexamethasone, and Manual therapy.  All included unless contraindicated  PLAN FOR NEXT SESSION: assess response to DN, lumbar mobility, mobs if needed, manual as needed,  hip flexibility, continue working on building core strength       JKearney Hard PT, MPT 02/27/22 9:43 AM PHYSICAL THERAPY DISCHARGE SUMMARY  Visits from Start of Care: 6  Current functional level related to goals / functional outcomes: See above   Remaining deficits: See above   Education / Equipment: HEP   Patient agrees to discharge. Patient goals were partially met. Patient is being discharged due to not returning since the last visit.

## 2022-03-04 ENCOUNTER — Encounter (INDEPENDENT_AMBULATORY_CARE_PROVIDER_SITE_OTHER): Payer: Self-pay

## 2022-03-19 ENCOUNTER — Encounter: Payer: Self-pay | Admitting: Family

## 2022-03-19 ENCOUNTER — Ambulatory Visit (INDEPENDENT_AMBULATORY_CARE_PROVIDER_SITE_OTHER): Payer: Medicare PPO | Admitting: Family

## 2022-03-19 VITALS — BP 119/78 | HR 81 | Temp 98.3°F | Resp 16 | Ht 70.71 in | Wt 226.0 lb

## 2022-03-19 DIAGNOSIS — Z130 Encounter for screening for diseases of the blood and blood-forming organs and certain disorders involving the immune mechanism: Secondary | ICD-10-CM | POA: Diagnosis not present

## 2022-03-19 DIAGNOSIS — Z0000 Encounter for general adult medical examination without abnormal findings: Secondary | ICD-10-CM

## 2022-03-19 DIAGNOSIS — Z23 Encounter for immunization: Secondary | ICD-10-CM | POA: Diagnosis not present

## 2022-03-19 DIAGNOSIS — R32 Unspecified urinary incontinence: Secondary | ICD-10-CM | POA: Diagnosis not present

## 2022-03-19 DIAGNOSIS — Z1329 Encounter for screening for other suspected endocrine disorder: Secondary | ICD-10-CM | POA: Diagnosis not present

## 2022-03-19 DIAGNOSIS — Z1321 Encounter for screening for nutritional disorder: Secondary | ICD-10-CM | POA: Diagnosis not present

## 2022-03-19 DIAGNOSIS — G8929 Other chronic pain: Secondary | ICD-10-CM | POA: Diagnosis not present

## 2022-03-19 DIAGNOSIS — Z0001 Encounter for general adult medical examination with abnormal findings: Secondary | ICD-10-CM

## 2022-03-19 DIAGNOSIS — Z0289 Encounter for other administrative examinations: Secondary | ICD-10-CM | POA: Insufficient documentation

## 2022-03-19 DIAGNOSIS — R252 Cramp and spasm: Secondary | ICD-10-CM

## 2022-03-19 DIAGNOSIS — M79672 Pain in left foot: Secondary | ICD-10-CM

## 2022-03-19 DIAGNOSIS — Z13228 Encounter for screening for other metabolic disorders: Secondary | ICD-10-CM | POA: Diagnosis not present

## 2022-03-19 DIAGNOSIS — Z1211 Encounter for screening for malignant neoplasm of colon: Secondary | ICD-10-CM

## 2022-03-19 DIAGNOSIS — M79671 Pain in right foot: Secondary | ICD-10-CM

## 2022-03-19 NOTE — Progress Notes (Signed)
Subjective:   Thomas Moody is a 67 y.o. male who presents for an Initial Medicare Annual Wellness Visit.  Review of Systems    Defer to PCP       Objective:   Pt request referral to podiatrist due to arch support  -states that he has been experiencing cramps in legs and feet    Today's Vitals   03/19/22 1005  BP: 119/78  Pulse: 81  Resp: 16  Temp: 98.3 F (36.8 C)  SpO2: 94%  Weight: 226 lb (102.5 kg)  Height: 5' 10.71" (1.796 m)  PainSc: 0-No pain   Body mass index is 31.78 kg/m.     01/29/2022    9:40 AM  Advanced Directives  Does Patient Have a Medical Advance Directive? No  Would patient like information on creating a medical advance directive? No - Patient declined    Current Medications (verified) Outpatient Encounter Medications as of 03/19/2022  Medication Sig   aspirin 81 MG tablet Take 81 mg by mouth daily.   atorvastatin (LIPITOR) 40 MG tablet Take 1 tablet (40 mg total) by mouth daily.   CINNAMON PO Take 1 tablet by mouth daily.   meloxicam (MOBIC) 7.5 MG tablet Take 1 tablet (7.5 mg total) by mouth daily.   predniSONE (STERAPRED UNI-PAK 21 TAB) 10 MG (21) TBPK tablet Take as directed   triamcinolone cream (KENALOG) 0.1 % Apply 1 Application topically 2 (two) times daily.   [DISCONTINUED] meclizine (ANTIVERT) 12.5 MG tablet Take 1 tablet (12.5 mg total) by mouth 3 (three) times daily as needed for dizziness.   [DISCONTINUED] ondansetron (ZOFRAN ODT) 4 MG disintegrating tablet Take 1 tablet (4 mg total) by mouth every 8 (eight) hours as needed for nausea or vomiting.   [DISCONTINUED] ondansetron (ZOFRAN) 4 MG tablet Take 1 tablet (4 mg total) by mouth every 8 (eight) hours as needed for nausea or vomiting.   [DISCONTINUED] predniSONE (STERAPRED UNI-PAK 21 TAB) 10 MG (21) TBPK tablet Take as directed   [DISCONTINUED] sulfamethoxazole-trimethoprim (BACTRIM DS) 800-160 MG tablet Take 1 tablet by mouth 2 (two) times daily.   No facility-administered  encounter medications on file as of 03/19/2022.    Allergies (verified) Patient has no known allergies.   History: Past Medical History:  Diagnosis Date   Asthma    GERD (gastroesophageal reflux disease)    Past Surgical History:  Procedure Laterality Date   PILONIDAL CYST EXCISION     Family History  Problem Relation Age of Onset   Heart disease Mother    Dementia Father    Hypertension Sister    Diabetes Son    Diabetes type I Son    Diabetes Paternal Grandmother    Diabetes Son    Diabetes type II Son    Autism Son        middle son   Social History   Socioeconomic History   Marital status: Married    Spouse name: Not on file   Number of children: Not on file   Years of education: Not on file   Highest education level: Not on file  Occupational History   Not on file  Tobacco Use   Smoking status: Never    Passive exposure: Never   Smokeless tobacco: Never  Vaping Use   Vaping Use: Never used  Substance and Sexual Activity   Alcohol use: Not Currently    Alcohol/week: 0.0 standard drinks of alcohol    Comment: occ   Drug use: No   Sexual  activity: Yes  Other Topics Concern   Not on file  Social History Narrative   Married; 3 sons   Social Determinants of Health   Financial Resource Strain: Not on file  Food Insecurity: Not on file  Transportation Needs: Not on file  Physical Activity: Not on file  Stress: Not on file  Social Connections: Not on file    Tobacco Counseling Counseling given: Not Answered   Clinical Intake:  Pre-visit preparation completed: Yes  Pain : 0-10 Pain Score: 0-No pain     Diabetes: No  How often do you need to have someone help you when you read instructions, pamphlets, or other written materials from your doctor or pharmacy?: 1 - Never  Diabetic- Prediabetes last A1c value at 6.0% 01/11/22  Interpreter Needed?: No      Activities of Daily Living    03/18/2022    1:32 PM  In your present state of  health, do you have any difficulty performing the following activities:  Hearing? 0  Vision? 1  Difficulty concentrating or making decisions? 1  Walking or climbing stairs? 0  Dressing or bathing? 0  Doing errands, shopping? 0  Preparing Food and eating ? N  Using the Toilet? N  In the past six months, have you accidently leaked urine? Y  Do you have problems with loss of bowel control? N  Managing your Medications? N  Managing your Finances? N  Housekeeping or managing your Housekeeping? N    Patient Care Team: Camillia Herter, NP as PCP - General (Nurse Practitioner)  Indicate any recent Medical Services you may have received from other than [facility] providers in the past year (date may be approximate).     Assessment:   This is a routine wellness examination for Thomas Moody.  Hearing/Vision screen No results found.  Dietary issues and exercise activities discussed:     Goals Addressed   None   Depression Screen    01/11/2022   10:55 AM 07/12/2021   10:20 AM 01/24/2021    9:24 AM 07/28/2020    3:25 PM 05/10/2020    9:10 AM 03/03/2018   10:17 AM 03/22/2016    8:23 AM  PHQ 2/9 Scores  PHQ - 2 Score 0 0 0 0 0 0 0  PHQ- 9 Score  2   5 8      Fall Risk    03/18/2022    1:32 PM 07/12/2021   10:20 AM 07/28/2020    3:24 PM 05/10/2020    9:10 AM 03/22/2016    8:23 AM  Fall Risk   Falls in the past year? 0 0 0 0 No  Number falls in past yr: 0 0 0 0   Injury with Fall? 0 0 0 0   Risk for fall due to :  No Fall Risks No Fall Risks No Fall Risks   Follow up  Falls evaluation completed Falls evaluation completed Falls evaluation completed     [place]:  Any stairs in or around the home? No  If so, are there any without handrails?  N/A Home free of loose throw rugs in walkways, pet beds, electrical cords, etc? Yes  Adequate lighting in your home to reduce risk of falls? Yes   ASSISTIVE DEVICES UTILIZED TO PREVENT FALLS:  Life alert? No  Use of a  cane, walker or w/c? No  Grab bars in the bathroom? No  Shower chair or bench in shower? Yes  Elevated toilet  seat or a handicapped toilet? No   TIMED UP AND GO:  Was the test performed? .  Length of time to ambulate 10 feet:  sec.     Cognitive Function:    07/28/2020    3:26 PM  MMSE - Mini Mental State Exam  Orientation to time 5  Orientation to Place 5  Registration 3  Attention/ Calculation 5  Recall 3  Language- name 2 objects 2  Language- repeat 1  Language- follow 3 step command 3  Language- read & follow direction 1  Write a sentence 1  Copy design 1  Total score 30        Immunizations Immunization History  Administered Date(s) Administered   Fluad Quad(high Dose 65+) 01/11/2022   Influenza Split 02/11/2014   Influenza, Quadrivalent, Recombinant, Inj, Pf 02/09/2018   Influenza,inj,Quad PF,6+ Mos 01/11/2016, 01/24/2021   PFIZER(Purple Top)SARS-COV-2 Vaccination 04/19/2019, 05/19/2019, 09/25/2021   Pfizer Covid-19 Vaccine Bivalent Booster 72yrs & up 12/13/2020   Pneumococcal Polysaccharide-23 04/08/2014, 01/24/2021   Tdap 04/08/2014   Zoster Recombinat (Shingrix) 02/03/2015, 09/25/2021    TDAP status: Up to date  Flu Vaccine status: Up to date    Covid-19 vaccine status: Completed vaccines  Qualifies for Shingles Vaccine? Yes   Zostavax completed Yes   Shingrix Completed?: Yes  Screening Tests Health Maintenance  Topic Date Due   Fecal DNA (Cologuard)  11/18/2021   COVID-19 Vaccine (5 - 2023-24 season) 11/20/2021   Pneumonia Vaccine 28+ Years old (2 - PCV) 01/24/2022   Medicare Annual Wellness (AWV)  03/20/2023   DTaP/Tdap/Td (2 - Td or Tdap) 04/08/2024   INFLUENZA VACCINE  Completed   Hepatitis C Screening  Completed   Zoster Vaccines- Shingrix  Completed   HPV VACCINES  Aged Out    Health Maintenance  Health Maintenance Due  Topic Date Due   Fecal DNA (Cologuard)  11/18/2021   COVID-19 Vaccine (5 - 2023-24 season) 11/20/2021    Pneumonia Vaccine 18+ Years old (2 - PCV) 01/24/2022    Colorectal cancer screening: Referral to GI placed Cologuard. Pt aware the office will call re: appt.  Lung Cancer Screening: (Low Dose CT Chest recommended if Age 71-80 years, 30 pack-year currently smoking OR have quit w/in 15years.) does not qualify.   Lung Cancer Screening Referral: N/A   Additional Screening:  Hepatitis C Screening: does qualify; Completed 04/08/2014  Vision Screening: Recommended annual ophthalmology exams for early detection of glaucoma and other disorders of the eye. Is the patient up to date with their annual eye exam?  Yes  Who is the provider or what is the name of the office in which the patient attends annual eye exams? [place]  If pt is not established with a provider, would they like to be referred to a provider to establish care?  N/A  .   Dental Screening: Recommended annual dental exams for proper oral hygiene  Community Resource Referral / Chronic Care Management: CRR required this visit?  No   CCM required this visit?  No      Plan:     I have personally reviewed and noted the following in the patient's chart:   Medical and social history Use of alcohol, tobacco or illicit drugs  Current medications and supplements including opioid prescriptions. Patient is not currently taking opioid prescriptions. Functional ability and status Nutritional status Physical activity Advanced directives List of other physicians Hospitalizations, surgeries, and ER visits in previous 12 months Vitals Screenings to include cognitive, depression,  and falls Referrals and appointments  In addition, I have reviewed and discussed with patient certain preventive protocols, quality metrics, and best practice recommendations. A written personalized care plan for preventive services as well as general preventive health recommendations were provided to patient.     Elmon Else,  [place]   03/19/2022

## 2022-03-19 NOTE — Progress Notes (Signed)
Subjective:   Thomas Moody is a 67 y.o. male who presents for an Initial Medicare Annual Wellness Visit.  Review of Systems    - Requests referral to podiatrist due to arch support and feet discomfort.  - Bilateral leg cramping during nighttime. States taking mustard helps.  - Endorses urine leakage with no additional symptoms. He declines trial of medication. He is ready for specialist referral.   Objective:   Today's Vitals   03/19/22 1005  BP: 119/78  Pulse: 81  Resp: 16  Temp: 98.3 F (36.8 C)  SpO2: 94%  Weight: 226 lb (102.5 kg)  Height: 5' 10.71" (1.796 m)  PainSc: 0-No pain   Body mass index is 31.78 kg/m.  Physical Exam HENT:     Head: Normocephalic and atraumatic.  Eyes:     Extraocular Movements: Extraocular movements intact.     Conjunctiva/sclera: Conjunctivae normal.     Pupils: Pupils are equal, round, and reactive to light.  Cardiovascular:     Rate and Rhythm: Normal rate and regular rhythm.     Pulses: Normal pulses.     Heart sounds: Normal heart sounds.  Pulmonary:     Effort: Pulmonary effort is normal.     Breath sounds: Normal breath sounds.  Musculoskeletal:     Cervical back: Normal range of motion and neck supple.  Neurological:     General: No focal deficit present.     Mental Status: He is alert and oriented to person, place, and time.  Psychiatric:        Mood and Affect: Mood normal.        Behavior: Behavior normal.        01/29/2022    9:40 AM  Advanced Directives  Does Patient Have a Medical Advance Directive? No  Would patient like information on creating a medical advance directive? No - Patient declined    Current Medications (verified) Outpatient Encounter Medications as of 03/19/2022  Medication Sig   aspirin 81 MG tablet Take 81 mg by mouth daily.   atorvastatin (LIPITOR) 40 MG tablet Take 1 tablet (40 mg total) by mouth daily.   CINNAMON PO Take 1 tablet by mouth daily.   meloxicam (MOBIC) 7.5 MG tablet Take 1  tablet (7.5 mg total) by mouth daily.   predniSONE (STERAPRED UNI-PAK 21 TAB) 10 MG (21) TBPK tablet Take as directed   triamcinolone cream (KENALOG) 0.1 % Apply 1 Application topically 2 (two) times daily.   [DISCONTINUED] meclizine (ANTIVERT) 12.5 MG tablet Take 1 tablet (12.5 mg total) by mouth 3 (three) times daily as needed for dizziness.   [DISCONTINUED] ondansetron (ZOFRAN ODT) 4 MG disintegrating tablet Take 1 tablet (4 mg total) by mouth every 8 (eight) hours as needed for nausea or vomiting.   [DISCONTINUED] ondansetron (ZOFRAN) 4 MG tablet Take 1 tablet (4 mg total) by mouth every 8 (eight) hours as needed for nausea or vomiting.   [DISCONTINUED] predniSONE (STERAPRED UNI-PAK 21 TAB) 10 MG (21) TBPK tablet Take as directed   [DISCONTINUED] sulfamethoxazole-trimethoprim (BACTRIM DS) 800-160 MG tablet Take 1 tablet by mouth 2 (two) times daily.   No facility-administered encounter medications on file as of 03/19/2022.    Allergies (verified) Patient has no known allergies.   History: Past Medical History:  Diagnosis Date   Asthma    GERD (gastroesophageal reflux disease)    Past Surgical History:  Procedure Laterality Date   PILONIDAL CYST EXCISION     Family History  Problem Relation Age of  Onset   Heart disease Mother    Dementia Father    Hypertension Sister    Diabetes Son    Diabetes type I Son    Diabetes Paternal Grandmother    Diabetes Son    Diabetes type II Son    Autism Son        middle son   Social History   Socioeconomic History   Marital status: Married    Spouse name: Not on file   Number of children: Not on file   Years of education: Not on file   Highest education level: Not on file  Occupational History   Not on file  Tobacco Use   Smoking status: Never    Passive exposure: Never   Smokeless tobacco: Never  Vaping Use   Vaping Use: Never used  Substance and Sexual Activity   Alcohol use: Not Currently    Alcohol/week: 0.0 standard  drinks of alcohol    Comment: occ   Drug use: No   Sexual activity: Yes  Other Topics Concern   Not on file  Social History Narrative   Married; 3 sons   Social Determinants of Health   Financial Resource Strain: Not on file  Food Insecurity: Not on file  Transportation Needs: Not on file  Physical Activity: Not on file  Stress: Not on file  Social Connections: Not on file    Tobacco Counseling Reports smoked socially for 1 month when he was in high school. Confirms no recent or present smoking.   Clinical Intake:  Pre-visit preparation completed: Yes  Pain : 0-10 Pain Score: 0-No pain  Diabetes: No  How often do you need to have someone help you when you read instructions, pamphlets, or other written materials from your doctor or pharmacy?: 1 - Never  Diabetic-  Prediabetes last A1c value at 6.0% 01/11/2022  Interpreter Needed?: No  Activities of Daily Living    03/18/2022    1:32 PM  In your present state of health, do you have any difficulty performing the following activities:  Hearing? 0  Vision? 1  Difficulty concentrating or making decisions? 1  Walking or climbing stairs? 0  Dressing or bathing? 0  Doing errands, shopping? 0  Preparing Food and eating ? N  Using the Toilet? N  In the past six months, have you accidently leaked urine? Y  Do you have problems with loss of bowel control? N  Managing your Medications? N  Managing your Finances? N  Housekeeping or managing your Housekeeping? N    Patient Care Team: Camillia Herter, NP as PCP - General (Family Medicine) Indicate any recent Medical Services you may have received from other than [facility] providers in the past year (date may be approximate).   Assessment:   This is a routine wellness examination for Thomas Moody.  Hearing/Vision screen - Patient states he has an eye doctor.  - Patient does not have any hearing issues.   Dietary issues and exercise activities discussed: yes  Goals Addressed:  maintain his health   Depression Screen    01/11/2022   10:55 AM 07/12/2021   10:20 AM 01/24/2021    9:24 AM 07/28/2020    3:25 PM 05/10/2020    9:10 AM 03/03/2018   10:17 AM 03/22/2016    8:23 AM  PHQ 2/9 Scores  PHQ - 2 Score 0 0 0 0 0 0 0  PHQ- 9 Score  2   5 8      Fall Risk  03/18/2022    1:32 PM 07/12/2021   10:20 AM 07/28/2020    3:24 PM 05/10/2020    9:10 AM 03/22/2016    8:23 AM  Fall Risk   Falls in the past year? 0 0 0 0 No  Number falls in past yr: 0 0 0 0   Injury with Fall? 0 0 0 0   Risk for fall due to :  No Fall Risks No Fall Risks No Fall Risks   Follow up  Falls evaluation completed Falls evaluation completed Falls evaluation completed     [place]: Any stairs in or around the home? No  If so, are there any without handrails?  N/A Home free of loose throw rugs in walkways, pet beds, electrical cords, etc? Yes  Adequate lighting in your home to reduce risk of falls? Yes   ASSISTIVE DEVICES UTILIZED TO PREVENT FALLS: Life alert? No  Use of a cane, walker or w/c? No  Grab bars in the bathroom? No  Shower chair or bench in shower? Yes  Elevated toilet seat or a handicapped toilet? No   TIMED UP AND GO: Was the test performed? Yes .  Length of time to ambulate 10 feet: 8 sec.   Gait steady and fast without use of assistive device  Cognitive Function:    07/28/2020    3:26 PM  MMSE - Mini Mental State Exam  Orientation to time 5  Orientation to Place 5  Registration 3  Attention/ Calculation 5  Recall 3  Language- name 2 objects 2  Language- repeat 1  Language- follow 3 step command 3  Language- read & follow direction 1  Write a sentence 1  Copy design 1  Total score 30   Immunizations Immunization History  Administered Date(s) Administered   Fluad Quad(high Dose 65+) 01/11/2022   Influenza Split 02/11/2014   Influenza, Quadrivalent, Recombinant, Inj, Pf 02/09/2018   Influenza,inj,Quad PF,6+ Mos 01/11/2016,  01/24/2021   PFIZER(Purple Top)SARS-COV-2 Vaccination 04/19/2019, 05/19/2019, 09/25/2021   Pfizer Covid-19 Vaccine Bivalent Booster 49yrs & up 12/13/2020   Pneumococcal Polysaccharide-23 04/08/2014, 01/24/2021, 03/19/2022   Tdap 04/08/2014   Zoster Recombinat (Shingrix) 02/03/2015, 09/25/2021    TDAP status: Up to date  Flu Vaccine status: Up to date  Pneumococcal vaccine status: Declined,  Education has been provided regarding the importance of this vaccine but patient still declined. Advised may receive this vaccine at local pharmacy or Health Dept. Aware to provide a copy of the vaccination record if obtained from local pharmacy or Health Dept. Verbalized acceptance and understanding.   Covid-19 vaccine status: Completed vaccines  Qualifies for Shingles Vaccine? Yes   Zostavax completed Yes   Shingrix Completed?: Yes  Screening Tests Health Maintenance  Topic Date Due   Fecal DNA (Cologuard)  11/18/2021   COVID-19 Vaccine (5 - 2023-24 season) 11/20/2021   Pneumonia Vaccine 59+ Years old (2 - PCV) 03/20/2023   Medicare Annual Wellness (AWV)  03/20/2023   DTaP/Tdap/Td (2 - Td or Tdap) 04/08/2024   INFLUENZA VACCINE  Completed   Hepatitis C Screening  Completed   Zoster Vaccines- Shingrix  Completed   HPV VACCINES  Aged Out    Health Maintenance  Health Maintenance Due  Topic Date Due   Fecal DNA (Cologuard)  11/18/2021   COVID-19 Vaccine (5 - 2023-24 season) 11/20/2021   Colorectal cancer screening: Referral to GI placed Cologuard. Pt aware the office will call re: appt.  Lung Cancer Screening: (Low Dose  CT Chest recommended if Age 17-80 years, 30 pack-year currently smoking OR have quit w/in 15years.) does not qualify.   Lung Cancer Screening Referral: N/A   Additional Screening:  Hepatitis C Screening: does qualify; Completed 04/08/2014  Vision Screening: Recommended annual ophthalmology exams for early detection of glaucoma and other disorders of the eye. Is  the patient up to date with their annual eye exam?  Yes  Who is the provider or what is the name of the office in which the patient attends annual eye exams? [facility]  If pt is not established with a provider, would they like to be referred to a provider to establish care?  N/A  .   Dental Screening: Recommended annual dental exams for proper oral hygiene  Community Resource Referral / Chronic Care Management: CRR required this visit?  No   CCM required this visit?  No   Plan:  1. Medicare annual wellness visit, initial - Counseled on 150 minutes of exercise per week, healthy eating (including decreased daily intake of saturated fats, cholesterol, added sugars, sodium), STI prevention, routine healthcare maintenance.   2. Screening for metabolic disorder - Routine screening.  - CMP14+EGFR  3. Screening for deficiency anemia - Routine screening.  - CBC  4. Thyroid disorder screen - Routine screening.  - TSH  5. Leg cramping 6. Encounter for vitamin deficiency screening - Routine screening.  - Vitamin D, 25-hydroxy  7. Chronic pain of both feet 8. Arch pain of left foot 9. Arch pain of right foot - Referral to Podiatry for further evaluation/management.  - Ambulatory referral to Podiatry  10. Urinary incontinence, unspecified type - Referral to Urology for further evaluation/management. - Ambulatory referral to Urology  11. Colon cancer screening - Routine screening.  - Cologuard  12. Need for pneumococcal vaccination - Administered.  - Pneumococcal polysaccharide vaccine 23-valent greater than or equal to 2yo subcutaneous/IM   I have personally reviewed and noted the following in the patient's chart:   Medical and social history Use of alcohol, tobacco or illicit drugs  Current medications and supplements including opioid prescriptions. Patient is not currently taking opioid prescriptions. Functional ability and status Nutritional  status Physical activity Advanced directives List of other physicians Hospitalizations, surgeries, and ER visits in previous 12 months Vitals Screenings to include cognitive, depression, and falls Referrals and appointments  In addition, I have reviewed and discussed with patient certain preventive protocols, quality metrics, and best practice recommendations. A written personalized care plan for preventive services as well as general preventive health recommendations were provided to patient.     Rema Fendt, NP   03/19/2022

## 2022-03-20 ENCOUNTER — Other Ambulatory Visit: Payer: Self-pay | Admitting: Family

## 2022-03-20 DIAGNOSIS — E559 Vitamin D deficiency, unspecified: Secondary | ICD-10-CM

## 2022-03-20 DIAGNOSIS — D72819 Decreased white blood cell count, unspecified: Secondary | ICD-10-CM

## 2022-03-20 LAB — VITAMIN D 25 HYDROXY (VIT D DEFICIENCY, FRACTURES): Vit D, 25-Hydroxy: 11.5 ng/mL — ABNORMAL LOW (ref 30.0–100.0)

## 2022-03-20 LAB — CMP14+EGFR
ALT: 26 IU/L (ref 0–44)
AST: 21 IU/L (ref 0–40)
Albumin/Globulin Ratio: 1.5 (ref 1.2–2.2)
Albumin: 4.1 g/dL (ref 3.9–4.9)
Alkaline Phosphatase: 67 IU/L (ref 44–121)
BUN/Creatinine Ratio: 10 (ref 10–24)
BUN: 10 mg/dL (ref 8–27)
Bilirubin Total: 0.5 mg/dL (ref 0.0–1.2)
CO2: 21 mmol/L (ref 20–29)
Calcium: 9.6 mg/dL (ref 8.6–10.2)
Chloride: 105 mmol/L (ref 96–106)
Creatinine, Ser: 0.99 mg/dL (ref 0.76–1.27)
Globulin, Total: 2.7 g/dL (ref 1.5–4.5)
Glucose: 132 mg/dL — ABNORMAL HIGH (ref 70–99)
Potassium: 3.9 mmol/L (ref 3.5–5.2)
Sodium: 142 mmol/L (ref 134–144)
Total Protein: 6.8 g/dL (ref 6.0–8.5)
eGFR: 83 mL/min/{1.73_m2} (ref >59)

## 2022-03-20 LAB — CBC
Hematocrit: 44.1 % (ref 37.5–51.0)
Hemoglobin: 14.8 g/dL (ref 13.0–17.7)
MCH: 30.3 pg (ref 26.6–33.0)
MCHC: 33.6 g/dL (ref 31.5–35.7)
MCV: 90 fL (ref 79–97)
Platelets: 260 10*3/uL (ref 150–450)
RBC: 4.88 x10E6/uL (ref 4.14–5.80)
RDW: 14.5 % (ref 11.6–15.4)
WBC: 3 10*3/uL — ABNORMAL LOW (ref 3.4–10.8)

## 2022-03-20 LAB — TSH: TSH: 1.31 u[IU]/mL (ref 0.450–4.500)

## 2022-03-20 MED ORDER — VITAMIN D (ERGOCALCIFEROL) 1.25 MG (50000 UNIT) PO CAPS: 50000.0000 [IU] | ORAL_CAPSULE | ORAL | 2 refills | Status: DC

## 2022-03-26 ENCOUNTER — Encounter: Payer: Self-pay | Admitting: Urology

## 2022-03-26 ENCOUNTER — Ambulatory Visit (INDEPENDENT_AMBULATORY_CARE_PROVIDER_SITE_OTHER): Payer: Medicare PPO | Admitting: Urology

## 2022-03-26 VITALS — BP 131/75 | HR 64 | Ht 70.0 in | Wt 227.0 lb

## 2022-03-26 DIAGNOSIS — N401 Enlarged prostate with lower urinary tract symptoms: Secondary | ICD-10-CM

## 2022-03-26 DIAGNOSIS — R339 Retention of urine, unspecified: Secondary | ICD-10-CM

## 2022-03-26 DIAGNOSIS — R32 Unspecified urinary incontinence: Secondary | ICD-10-CM | POA: Diagnosis not present

## 2022-03-26 DIAGNOSIS — R399 Unspecified symptoms and signs involving the genitourinary system: Secondary | ICD-10-CM

## 2022-03-26 LAB — BLADDER SCAN AMB NON-IMAGING

## 2022-03-26 LAB — URINALYSIS
Bilirubin, UA: NEGATIVE
Glucose, UA: NEGATIVE mg/dL
Ketones, UA: NEGATIVE
Leukocytes, UA: NEGATIVE
Nitrite, UA: NEGATIVE
Protein, UA: NEGATIVE
Spec Grav, UA: 1.02 (ref 1.010–1.025)
Urobilinogen, UA: 0.2 E.U./dL
pH, UA: 5.5 (ref 5.0–8.0)

## 2022-03-26 NOTE — Progress Notes (Signed)
Assessment: 1. Lower urinary tract symptoms (LUTS)   2. Incomplete bladder emptying      Plan: Today I had a long discussion with the patient regarding his lower urinary tract symptoms/BPH with incomplete bladder emptying.  I discussed with him the natural history of lower urinary tract symptoms and recommendations for further evaluation and treatment.  Today I also discussed the issues and controversies regarding prostate cancer early detection.  Following our discussion we will obtain PSA today  We will send urine for micro  Trial of tamsulosin 0.4 mg daily-nature of medication including proper utilization as well as potential adverse events and side effects reviewed.  Follow-up 4 months for symptom assessment and residual urine determination  Chief Complaint:  Chief Complaint  Patient presents with   Urinary Incontinence    History of Present Illness:  Thomas Moody is a 67 y.o. male who is seen in consultation from Camillia Herter, NP for evaluation of lower urinary tract symptoms.  Patient reports progressive worsening lower urinary tract symptoms over the last several years.    Current IPSS = 11.  Most bothersome symptoms are urinary frequency and urgency and occasional urge incontinence.  He also gets up approximately 3 times per night.  His quality-of-life score is 3.  PVR today = 198 mL Dip UA significant for 1+ blood-will send for microscopic exam  There is no family history of prostate cancer.  The patient's father did have symptomatic BPH  PSA DATA: 03/2014  1.05 02/2018  1.9   Past Medical History:  Past Medical History:  Diagnosis Date   Asthma    GERD (gastroesophageal reflux disease)     Past Surgical History:  Past Surgical History:  Procedure Laterality Date   PILONIDAL CYST EXCISION      Allergies:  No Known Allergies  Family History:  Family History  Problem Relation Age of Onset   Heart disease Mother    Dementia Father     Hypertension Sister    Diabetes Son    Diabetes type I Son    Diabetes Paternal Grandmother    Diabetes Son    Diabetes type II Son    Autism Son        middle son    Social History:  Social History   Tobacco Use   Smoking status: Never    Passive exposure: Never   Smokeless tobacco: Never  Vaping Use   Vaping Use: Never used  Substance Use Topics   Alcohol use: Not Currently    Alcohol/week: 0.0 standard drinks of alcohol    Comment: occ   Drug use: No    Review of symptoms:  Constitutional:  Negative for unexplained weight loss, night sweats, fever, chills ENT:  Negative for nose bleeds, sinus pain, painful swallowing CV:  Negative for chest pain, shortness of breath, exercise intolerance, palpitations, loss of consciousness Resp:  Negative for cough, wheezing, shortness of breath GI:  Negative for nausea, vomiting, diarrhea, bloody stools GU:  Positives noted in HPI; otherwise negative for gross hematuria, dysuria, urinary incontinence Neuro:  Negative for seizures, poor balance, limb weakness, slurred speech Psych:  Negative for lack of energy, depression, anxiety Endocrine:  Negative for polydipsia, polyuria, symptoms of hypoglycemia (dizziness, hunger, sweating) Hematologic:  Negative for anemia, purpura, petechia, prolonged or excessive bleeding, use of anticoagulants  Allergic:  Negative for difficulty breathing or choking as a result of exposure to anything; no shellfish allergy; no allergic response (rash/itch) to materials, foods  Physical  exam: BP 131/75   Pulse 64   Ht 5' 10"$  (1.778 m)   Wt 227 lb (103 kg)   BMI 32.57 kg/m  GENERAL APPEARANCE:  Well appearing, well developed, well nourished, NAD HEENT: Atraumatic, Normocephalic ABDOMEN: Soft, non-tender, No Masses.  GU: Normal uncircumcised phallus.  Bilateral testes and cords are palpably normal-no evidence of hernia DRE: Normal sphincter tone; prostate is actually difficult to fully palpate due to his  body habitus.  The apex is palpably normal.  EXTREMITIES: Moves all extremities well.  Without clubbing, cyanosis, or edema. NEUROLOGIC:  Alert and oriented x 3, normal gait, CN II-XII grossly intact.  MENTAL STATUS:  Appropriate. SKIN:  Warm, dry and intact.    Results: Results for orders placed or performed in visit on 03/26/22 (from the past 24 hour(s))  Bladder Scan (Post Void Residual) in office   Collection Time: 03/26/22 10:58 AM  Result Value Ref Range   Scan Result 151m

## 2022-03-27 LAB — URINALYSIS, MICROSCOPIC ONLY
Bacteria, UA: NONE SEEN
Casts: NONE SEEN /lpf
Epithelial Cells (non renal): NONE SEEN /hpf (ref 0–10)
RBC, Urine: NONE SEEN /hpf (ref 0–2)
WBC, UA: NONE SEEN /hpf (ref 0–5)

## 2022-03-27 LAB — PSA: Prostate Specific Ag, Serum: 1 ng/mL (ref 0.0–4.0)

## 2022-03-27 MED ORDER — TAMSULOSIN HCL 0.4 MG PO CAPS: 0.4000 mg | ORAL_CAPSULE | Freq: Every day | ORAL | 3 refills | Status: AC

## 2022-03-27 NOTE — Addendum Note (Signed)
Addended by: Pamala Hurry on: 03/27/2022 09:10 AM   Modules accepted: Orders

## 2022-03-31 DIAGNOSIS — Z1211 Encounter for screening for malignant neoplasm of colon: Secondary | ICD-10-CM | POA: Diagnosis not present

## 2022-04-10 LAB — COLOGUARD: COLOGUARD: NEGATIVE

## 2022-05-30 ENCOUNTER — Other Ambulatory Visit: Payer: Self-pay | Admitting: Family

## 2022-05-30 DIAGNOSIS — E785 Hyperlipidemia, unspecified: Secondary | ICD-10-CM

## 2022-05-31 NOTE — Telephone Encounter (Signed)
Requested medications are due for refill today.  Unsure  Requested medications are on the active medications list.  yes  Last refill. 01/11/2022 #30 2 rf  Future visit scheduled.   yes  Notes to clinic.  Rx written to expire 04/11/2022 - Rx is expired.    Requested Prescriptions  Pending Prescriptions Disp Refills   atorvastatin (LIPITOR) 40 MG tablet [Pharmacy Med Name: ATORVASTATIN  TABLETS] 30 tablet 2    Sig: TAKE 1 TABLET(40 MG) BY MOUTH DAILY     Cardiovascular:  Antilipid - Statins Failed - 05/30/2022 10:09 AM      Failed - Lipid Panel in normal range within the last 12 months    Cholesterol, Total  Date Value Ref Range Status  01/11/2022 127 100 - 199 mg/dL Final   LDL Chol Calc (NIH)  Date Value Ref Range Status  01/11/2022 49 0 - 99 mg/dL Final   HDL  Date Value Ref Range Status  01/11/2022 58 >39 mg/dL Final   Triglycerides  Date Value Ref Range Status  01/11/2022 109 0 - 149 mg/dL Final         Passed - Patient is not pregnant      Passed - Valid encounter within last 12 months    Recent Outpatient Visits           2 months ago Medicare annual wellness visit, initial   [place] [facility], [place], NP   4 months ago Prediabetes    [facility] at [facility], [place], [place]   10 months ago Prediabetes   [facility], [place], NP   1 year ago Prediabetes   [facility] at [facility], [place], NP   1 year ago [facility] preventive visit   [facility] at [facility], [place] Fick, NP       Future Appointments             In 1 month Margo Aye Duanne Guess, MD [facility] at [facility]

## 2022-06-10 ENCOUNTER — Other Ambulatory Visit: Payer: Self-pay | Admitting: Family

## 2022-06-10 DIAGNOSIS — E559 Vitamin D deficiency, unspecified: Secondary | ICD-10-CM

## 2022-07-09 ENCOUNTER — Encounter: Payer: Self-pay | Admitting: Urology

## 2022-07-09 ENCOUNTER — Ambulatory Visit (INDEPENDENT_AMBULATORY_CARE_PROVIDER_SITE_OTHER): Payer: Medicare PPO | Admitting: Urology

## 2022-07-09 VITALS — BP 119/69 | HR 75 | Ht 70.5 in | Wt 225.0 lb

## 2022-07-09 DIAGNOSIS — N401 Enlarged prostate with lower urinary tract symptoms: Secondary | ICD-10-CM

## 2022-07-09 DIAGNOSIS — R339 Retention of urine, unspecified: Secondary | ICD-10-CM

## 2022-07-09 DIAGNOSIS — R399 Unspecified symptoms and signs involving the genitourinary system: Secondary | ICD-10-CM | POA: Diagnosis not present

## 2022-07-09 DIAGNOSIS — R350 Frequency of micturition: Secondary | ICD-10-CM | POA: Diagnosis not present

## 2022-07-09 DIAGNOSIS — N3941 Urge incontinence: Secondary | ICD-10-CM

## 2022-07-09 LAB — BLADDER SCAN AMB NON-IMAGING

## 2022-07-09 NOTE — Progress Notes (Signed)
   Assessment: 1. Lower urinary tract symptoms (LUTS)   2. Incomplete bladder emptying     Plan: Continue tamsulosin Follow-up 1 year or sooner if problems arise  Chief Complaint: LUTS  HPI: Thomas Moody is a 67 y.o. male who presents for continued evaluation of BPH/lower urinary tract symptoms with incomplete bladder emptying. Please see my note 03/26/2022 at the time of initial visit for detailed history.  At that time the patient was started on tamsulosin.  Marlene reports that he has had significant improvement in his lower urinary tract symptoms. IPSS = 7/2 PVR today = 22 mL He is quite pleased with his current status.  Baseline IPSS = 11.  Most bothersome symptoms are urinary frequency and urgency and occasional urge incontinence.  He also gets up approximately 3 times per night.  His quality-of-life score is 3.   PVR 03/2022 = 198 mL    There is no family history of prostate cancer.  The patient's father did have symptomatic BPH   PSA DATA: 03/2014             1.05 02/2018             1.9 03/2022  1.0    Portions of the above documentation were copied from a prior visit for review purposes only.  Allergies: No Known Allergies  PMH: Past Medical History:  Diagnosis Date   Asthma    GERD (gastroesophageal reflux disease)     PSH: Past Surgical History:  Procedure Laterality Date   PILONIDAL CYST EXCISION      SH: Social History   Tobacco Use   Smoking status: Never    Passive exposure: Never   Smokeless tobacco: Never  Vaping Use   Vaping Use: Never used  Substance Use Topics   Alcohol use: Not Currently    Alcohol/week: 0.0 standard drinks of alcohol    Comment: occ   Drug use: No    ROS: Constitutional:  Negative for fever, chills, weight loss CV: Negative for chest pain, previous MI, hypertension Respiratory:  Negative for shortness of breath, wheezing, sleep apnea, frequent cough GI:  Negative for nausea, vomiting, bloody stool,  GERD  PE: BP 119/69   Pulse 75   Ht 5' 10.5" (1.791 m)   Wt 225 lb (102.1 kg)   BMI 31.83 kg/m  GENERAL APPEARANCE:  Well appearing, well developed, well nourished, NAD    Results: Results for orders placed or performed in visit on 07/09/22 (from the past 24 hour(s))  BLADDER SCAN AMB NON-IMAGING   Collection Time: 07/09/22 12:00 AM  Result Value Ref Range   Scan Result 22ml

## 2022-07-11 LAB — MICROSCOPIC EXAMINATION
Cast Type: NONE SEEN
Casts: NONE SEEN /lpf
Crystal Type: NONE SEEN
Crystals: NONE SEEN
Epithelial Cells (non renal): NONE SEEN /hpf (ref 0–10)
Renal Epithel, UA: NONE SEEN /hpf
Trichomonas, UA: NONE SEEN
Yeast, UA: NONE SEEN

## 2022-07-11 LAB — URINALYSIS, ROUTINE W REFLEX MICROSCOPIC
Bilirubin, UA: NEGATIVE
Glucose, UA: NEGATIVE
Ketones, UA: NEGATIVE
Leukocytes,UA: NEGATIVE
Nitrite, UA: NEGATIVE
Protein,UA: NEGATIVE
Specific Gravity, UA: 1.03 (ref 1.005–1.030)
Urobilinogen, Ur: 0.2 mg/dL (ref 0.2–1.0)
pH, UA: 5.5 (ref 5.0–7.5)

## 2023-01-20 ENCOUNTER — Other Ambulatory Visit: Payer: Self-pay

## 2023-01-20 ENCOUNTER — Encounter (HOSPITAL_BASED_OUTPATIENT_CLINIC_OR_DEPARTMENT_OTHER): Payer: Self-pay

## 2023-01-20 ENCOUNTER — Emergency Department (HOSPITAL_BASED_OUTPATIENT_CLINIC_OR_DEPARTMENT_OTHER): Payer: Medicare PPO

## 2023-01-20 ENCOUNTER — Emergency Department (HOSPITAL_BASED_OUTPATIENT_CLINIC_OR_DEPARTMENT_OTHER)
Admission: EM | Admit: 2023-01-20 | Discharge: 2023-01-21 | Disposition: A | Discharge status: Home / Self Care | Payer: Medicare PPO | Attending: Emergency Medicine | Admitting: Emergency Medicine

## 2023-01-20 DIAGNOSIS — Z1152 Encounter for screening for COVID-19: Secondary | ICD-10-CM | POA: Insufficient documentation

## 2023-01-20 DIAGNOSIS — D72819 Decreased white blood cell count, unspecified: Secondary | ICD-10-CM | POA: Diagnosis not present

## 2023-01-20 DIAGNOSIS — M791 Myalgia, unspecified site: Secondary | ICD-10-CM | POA: Diagnosis not present

## 2023-01-20 DIAGNOSIS — K5790 Diverticulosis of intestine, part unspecified, without perforation or abscess without bleeding: Secondary | ICD-10-CM | POA: Diagnosis not present

## 2023-01-20 DIAGNOSIS — J45909 Unspecified asthma, uncomplicated: Secondary | ICD-10-CM | POA: Insufficient documentation

## 2023-01-20 DIAGNOSIS — N281 Cyst of kidney, acquired: Secondary | ICD-10-CM | POA: Diagnosis not present

## 2023-01-20 DIAGNOSIS — R5383 Other fatigue: Secondary | ICD-10-CM | POA: Diagnosis not present

## 2023-01-20 DIAGNOSIS — Z7982 Long term (current) use of aspirin: Secondary | ICD-10-CM | POA: Insufficient documentation

## 2023-01-20 DIAGNOSIS — K449 Diaphragmatic hernia without obstruction or gangrene: Secondary | ICD-10-CM | POA: Diagnosis not present

## 2023-01-20 DIAGNOSIS — R509 Fever, unspecified: Secondary | ICD-10-CM | POA: Diagnosis not present

## 2023-01-20 DIAGNOSIS — R109 Unspecified abdominal pain: Secondary | ICD-10-CM | POA: Diagnosis not present

## 2023-01-20 LAB — COMPREHENSIVE METABOLIC PANEL
ALT: 210 U/L — ABNORMAL HIGH (ref 0–44)
AST: 156 U/L — ABNORMAL HIGH (ref 15–41)
Albumin: 3.6 g/dL (ref 3.5–5.0)
Alkaline Phosphatase: 42 U/L (ref 38–126)
Anion gap: 8 (ref 5–15)
BUN: 15 mg/dL (ref 8–23)
CO2: 22 mmol/L (ref 22–32)
Calcium: 8.2 mg/dL — ABNORMAL LOW (ref 8.9–10.3)
Chloride: 101 mmol/L (ref 98–111)
Creatinine, Ser: 1.25 mg/dL — ABNORMAL HIGH (ref 0.61–1.24)
GFR, Estimated: >60 mL/min (ref >60)
Glucose, Bld: 106 mg/dL — ABNORMAL HIGH (ref 70–99)
Potassium: 3.4 mmol/L — ABNORMAL LOW (ref 3.5–5.1)
Sodium: 131 mmol/L — ABNORMAL LOW (ref 135–145)
Total Bilirubin: 1 mg/dL (ref <1.2)
Total Protein: 7.2 g/dL (ref 6.5–8.1)

## 2023-01-20 LAB — CBC WITH DIFFERENTIAL/PLATELET
Abs Immature Granulocytes: 0 10*3/uL (ref 0.00–0.07)
Basophils Absolute: 0 10*3/uL (ref 0.0–0.1)
Basophils Relative: 1 %
Eosinophils Absolute: 0 10*3/uL (ref 0.0–0.5)
Eosinophils Relative: 0 %
HCT: 41 % (ref 39.0–52.0)
Hemoglobin: 14.1 g/dL (ref 13.0–17.0)
Immature Granulocytes: 0 %
Lymphocytes Relative: 26 %
Lymphs Abs: 0.5 10*3/uL — ABNORMAL LOW (ref 0.7–4.0)
MCH: 29.6 pg (ref 26.0–34.0)
MCHC: 34.4 g/dL (ref 30.0–36.0)
MCV: 86.1 fL (ref 80.0–100.0)
Monocytes Absolute: 0.3 10*3/uL (ref 0.1–1.0)
Monocytes Relative: 16 %
Neutro Abs: 1.1 10*3/uL — ABNORMAL LOW (ref 1.7–7.7)
Neutrophils Relative %: 57 %
Platelets: 182 10*3/uL (ref 150–400)
RBC: 4.76 MIL/uL (ref 4.22–5.81)
RDW: 13.7 % (ref 11.5–15.5)
WBC: 1.9 10*3/uL — ABNORMAL LOW (ref 4.0–10.5)
nRBC: 0 % (ref 0.0–0.2)

## 2023-01-20 LAB — URINALYSIS, MICROSCOPIC (REFLEX): Squamous Epithelial / HPF: NONE SEEN /[HPF] (ref 0–5)

## 2023-01-20 LAB — URINALYSIS, ROUTINE W REFLEX MICROSCOPIC
Bilirubin Urine: NEGATIVE
Glucose, UA: NEGATIVE mg/dL
Ketones, ur: NEGATIVE mg/dL
Leukocytes,Ua: NEGATIVE
Nitrite: NEGATIVE
Protein, ur: 30 mg/dL — AB
Specific Gravity, Urine: 1.02 (ref 1.005–1.030)
pH: 5.5 (ref 5.0–8.0)

## 2023-01-20 LAB — RESP PANEL BY RT-PCR (RSV, FLU A&B, COVID)  RVPGX2
Influenza A by PCR: NEGATIVE
Influenza B by PCR: NEGATIVE
Resp Syncytial Virus by PCR: NEGATIVE
SARS Coronavirus 2 by RT PCR: NEGATIVE

## 2023-01-20 LAB — LACTIC ACID, PLASMA: Lactic Acid, Venous: 1.2 mmol/L (ref 0.5–1.9)

## 2023-01-20 MED ORDER — IOHEXOL 300 MG/ML  SOLN
100.0000 mL | Freq: Once | INTRAMUSCULAR | Status: AC | PRN
  Administered 2023-01-20: 100 mL via INTRAVENOUS

## 2023-01-20 MED ORDER — LACTATED RINGERS IV BOLUS
500.0000 mL | Freq: Once | INTRAVENOUS | Status: AC
  Administered 2023-01-20: 500 mL via INTRAVENOUS

## 2023-01-20 MED ORDER — ACETAMINOPHEN 500 MG PO TABS
ORAL_TABLET | ORAL | Status: AC
  Filled 2023-01-20: qty 2

## 2023-01-20 MED ORDER — ACETAMINOPHEN 500 MG PO TABS
1000.0000 mg | ORAL_TABLET | Freq: Once | ORAL | Status: AC
  Administered 2023-01-20: 1000 mg via ORAL

## 2023-01-20 NOTE — ED Triage Notes (Signed)
Pt arrives with c/o generalized body aches and fatigue that started last week. Pt denies cough or congestion.

## 2023-01-20 NOTE — ED Provider Notes (Signed)
[facility] Provider Note   CSN: [id number] Arrival date & time: 01/20/23  1856     History  Chief Complaint  Patient presents with   Generalized Body Aches    Thomas Moody is a 67 y.o. male.  HPI 67 year old male history of asthma, GERD presenting for body aches.  He states over last week he had diffuse bodyaches, subjective fever, fatigue.  No headache.  No nausea or vomiting..  Regular vomiting.  Occasional abdominal and right-sided back pain.  No radicular pain or weakness or numbness or difficulty going to the bathroom.  He has been peeing more often no dysuria.  No known sick contacts.  No cough runny nose.  No recent travel.     Home Medications Prior to Admission medications   Medication Sig Start Date End Date Taking? Authorizing Provider  aspirin 81 MG tablet Take 81 mg by mouth daily.    [provider]  atorvastatin (LIPITOR) 40 MG tablet TAKE 1 TABLET(40 MG) BY MOUTH DAILY 06/03/22   Rema Fendt, NP  CINNAMON PO Take 1 tablet by mouth daily.    [provider]  meloxicam (MOBIC) 7.5 MG tablet Take 1 tablet (7.5 mg total) by mouth daily. 01/11/22   Rema Fendt, NP  predniSONE (STERAPRED UNI-PAK 21 TAB) 10 MG (21) TBPK tablet Take as directed 08/21/21   Tarry Kos, MD  tamsulosin (FLOMAX) 0.4 MG CAPS capsule Take 1 capsule (0.4 mg total) by mouth daily. 03/27/22   Joline Maxcy, MD  triamcinolone cream (KENALOG) 0.1 % Apply 1 Application topically 2 (two) times daily. 11/27/21   Rema Fendt, NP  Vitamin D, Ergocalciferol, (DRISDOL) 1.25 MG (50000 UNIT) CAPS capsule TAKE 1 CAPSULE BY MOUTH EVERY 7 DAYS 06/10/22   Georganna Skeans, MD      Allergies    Patient has no known allergies.    Review of Systems   Review of Systems Review of systems completed and notable as per HPI.  ROS otherwise negative.   Physical Exam Updated Vital Signs BP 112/72 (BP Location: Right Arm)   Pulse 83    Temp 98.6 F (37 C) (Oral)   Resp 18   Ht 5\' 10"  (1.778 m)   Wt 95.3 kg   SpO2 97%   BMI 30.13 kg/m  Physical Exam Vitals and nursing note reviewed.  Constitutional:      General: He is not in acute distress.    Appearance: He is well-developed.  HENT:     Head: Normocephalic and atraumatic.     Nose: Nose normal.     Mouth/Throat:     Mouth: Mucous membranes are moist.     Pharynx: Oropharynx is clear.  Eyes:     Extraocular Movements: Extraocular movements intact.     Conjunctiva/sclera: Conjunctivae normal.     Pupils: Pupils are equal, round, and reactive to light.  Cardiovascular:     Rate and Rhythm: Normal rate and regular rhythm.     Pulses: Normal pulses.     Heart sounds: Normal heart sounds. No murmur heard. Pulmonary:     Effort: Pulmonary effort is normal. No respiratory distress.     Breath sounds: Normal breath sounds.  Abdominal:     Palpations: Abdomen is soft.     Tenderness: There is abdominal tenderness. There is no right CVA tenderness, left CVA tenderness, guarding or rebound.  Musculoskeletal:        General: No swelling.  Cervical back: Normal range of motion and neck supple. No rigidity or tenderness.     Right lower leg: No edema.     Left lower leg: No edema.  Skin:    General: Skin is warm and dry.     Capillary Refill: Capillary refill takes less than 2 seconds.  Neurological:     General: No focal deficit present.     Mental Status: He is alert and oriented to person, place, and time. Mental status is at baseline.  Psychiatric:        Mood and Affect: Mood normal.     ED Results / Procedures / Treatments   Labs (all labs ordered are listed, but only abnormal results are displayed) Labs Reviewed  CBC WITH DIFFERENTIAL/PLATELET - Abnormal; Notable for the following components:      Result Value   WBC 1.9 (*)    Neutro Abs 1.1 (*)    Lymphs Abs 0.5 (*)    All other components within normal limits  COMPREHENSIVE METABOLIC PANEL -  Abnormal; Notable for the following components:   Sodium 131 (*)    Potassium 3.4 (*)    Glucose, Bld 106 (*)    Creatinine, Ser 1.25 (*)    Calcium 8.2 (*)    AST 156 (*)    ALT 210 (*)    All other components within normal limits  RESP PANEL BY RT-PCR (RSV, FLU A&B, COVID)  RVPGX2  URINALYSIS, ROUTINE W REFLEX MICROSCOPIC  LACTIC ACID, PLASMA  LACTIC ACID, PLASMA    EKG None  Radiology No results found.  Procedures Procedures    Medications Ordered in ED Medications  acetaminophen (TYLENOL) tablet 1,000 mg (1,000 mg Oral Given 01/20/23 1918)  lactated ringers bolus 500 mL (500 mLs Intravenous New Bag/Given 01/20/23 2245)  iohexol (OMNIPAQUE) 300 MG/ML solution 100 mL (100 mLs Intravenous Contrast Given 01/20/23 2258)    ED Course/ Medical Decision Making/ A&P                                 Medical Decision Making Amount and/or Complexity of Data Reviewed Labs: ordered. Radiology: ordered.  Risk OTC drugs. Prescription drug management.   Medical Decision Making:   Thomas Moody is a 67 y.o. male who presented to the ED today with fever, myalgias.  Vital signs reviewed notable for initial fever resolved after Tylenol.  On exam he is well-appearing.  He reports some mild intermittent abdominal pain with this as well as some right-sided back pain.  No midline back pain or neurologic changes.  He also reports increased urination which could be related to urinary tract infection.  Otherwise notable for mild hyponatremia and transaminitis of unclear significance.  He is not focally tender in the right upper quadrant with normal bilirubin lower suspicion for cholecystitis.  However given his abdominal pain obtain CT scan evaluate for intra-abdominal cause of his fever.  No clinical signs of pneumonia or URI, will evaluate with chest x-ray.  No signs of CNS infection.  He has leukopenia with slight leukopenia and neutropenia.  Unclear if this could be viral in  nature.   Patient placed on continuous vitals and telemetry monitoring while in ED which was reviewed periodically.  Reviewed and confirmed nursing documentation for past medical history, family history, social history.  Reassessment and Plan:   Patient remained stable.  CT scan and x-ray are pending.  Handoff is given to [facility]  with plan to follow up imaging/labs and reassess.   Patient's presentation is most consistent with acute complicated illness / injury requiring diagnostic workup.           Final Clinical Impression(s) / ED Diagnoses Final diagnoses:  Fever, unspecified fever cause    Rx / DC Orders ED Discharge Orders     None         Laurence Spates, MD 01/20/23 [phone]

## 2023-01-21 DIAGNOSIS — R509 Fever, unspecified: Secondary | ICD-10-CM | POA: Diagnosis not present

## 2023-01-21 LAB — COMPREHENSIVE METABOLIC PANEL
ALT: 174 U/L — ABNORMAL HIGH (ref 0–44)
AST: 124 U/L — ABNORMAL HIGH (ref 15–41)
Albumin: 3.1 g/dL — ABNORMAL LOW (ref 3.5–5.0)
Alkaline Phosphatase: 38 U/L (ref 38–126)
Anion gap: 6 (ref 5–15)
BUN: 14 mg/dL (ref 8–23)
CO2: 23 mmol/L (ref 22–32)
Calcium: 7.7 mg/dL — ABNORMAL LOW (ref 8.9–10.3)
Chloride: 101 mmol/L (ref 98–111)
Creatinine, Ser: 1.06 mg/dL (ref 0.61–1.24)
GFR, Estimated: >60 mL/min (ref >60)
Glucose, Bld: 98 mg/dL (ref 70–99)
Potassium: 3.3 mmol/L — ABNORMAL LOW (ref 3.5–5.1)
Sodium: 130 mmol/L — ABNORMAL LOW (ref 135–145)
Total Bilirubin: 1.1 mg/dL (ref <1.2)
Total Protein: 6.3 g/dL — ABNORMAL LOW (ref 6.5–8.1)

## 2023-01-21 LAB — LACTIC ACID, PLASMA: Lactic Acid, Venous: 1.1 mmol/L (ref 0.5–1.9)

## 2023-01-21 MED ORDER — LACTATED RINGERS IV BOLUS
500.0000 mL | Freq: Once | INTRAVENOUS | Status: AC
  Administered 2023-01-21: 500 mL via INTRAVENOUS

## 2023-01-21 MED ORDER — IBUPROFEN 200 MG PO TABS
600.0000 mg | ORAL_TABLET | Freq: Once | ORAL | Status: AC
  Administered 2023-01-21: 600 mg via ORAL
  Filled 2023-01-21: qty 1

## 2023-01-21 NOTE — Discharge Instructions (Addendum)
You may take over-the-counter medicine for symptomatic relief, such as Tylenol, Motrin, TheraFlu, Alka seltzer , black elderberry, etc. Please limit acetaminophen (Tylenol) to 4000 mg and Ibuprofen (Motrin, Advil, etc.) to 2400 mg for a 24hr period. Please note that other over-the-counter medicine may contain acetaminophen or ibuprofen as a component of their ingredients.   

## 2023-01-21 NOTE — ED Notes (Signed)
Per MD, CMP is to be collected after second LR bolus is complete.

## 2023-01-21 NOTE — ED Provider Notes (Signed)
I assumed care of this patient from previous provider.  Please see their note for further details of history, exam, and MDM.   Briefly patient is a 67 y.o. male who presented with generalized malaise and myalgias noted to be febrile.  Workup was relatively reassuring.  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  He does have mild transaminitis without biliary obstruction.  Lactic acid was negative.  UA without infection.  Viral panel negative for COVID/influenza/RSV.  Chest x-ray without evidence of pneumonia.  CT scan of the abdomen pelvis negative for any acute intra-abdominal inflammatory/infectious process.  No obvious evidence of bacterial infection.  Favoring viral etiology but blood cultures drawn.   Patient was provided with IV fluids.  Repeat CMP with downtrending LFTs and improved renal function.  The patient appears reasonably screened and/or stabilized for discharge and I doubt any other medical condition or other [facility] requiring further screening, evaluation, or treatment in the ED at this time. I have discussed the findings, Dx and Tx plan with the patient/family who expressed understanding and agree(s) with the plan. Discharge instructions discussed at length. The patient/family was given strict return precautions who verbalized understanding of the instructions. No further questions at time of discharge.  Disposition: Discharge  Condition: Good  ED Discharge Orders     None         Follow Up: Rema Fendt, NP [address] [place] [address] [phone]  Call  to schedule an appointment for close follow up in 3-5 days         Lucile Hillmann, Amadeo Garnet, MD 01/21/23 0500

## 2023-01-22 ENCOUNTER — Encounter: Payer: Medicare PPO | Admitting: Family

## 2023-01-22 NOTE — Progress Notes (Signed)
Erroneous encounter-disregard

## 2023-01-23 ENCOUNTER — Ambulatory Visit
Admission: EM | Admit: 2023-01-23 | Discharge: 2023-01-23 | Disposition: A | Discharge status: Home / Self Care | Payer: Medicare PPO | Attending: Physician Assistant | Admitting: Physician Assistant

## 2023-01-23 DIAGNOSIS — R531 Weakness: Secondary | ICD-10-CM | POA: Diagnosis not present

## 2023-01-23 NOTE — ED Triage Notes (Signed)
Pt states generalized weakness.  Seen in the ED 2 days ago for the same. States they ran some tests but could not find anything.

## 2023-01-23 NOTE — ED Provider Notes (Signed)
Patient presents to urgent care for continued generalized weakness. EKG with similar findings compared to 2018. Recommended further evaluation in ED- wife to transport POV.    Tomi Bamberger, PA-C 01/23/23 1226

## 2023-01-23 NOTE — ED Notes (Signed)
Last ECG/EKG 2018. Rhythm strip obtained.

## 2023-01-23 NOTE — ED Notes (Signed)
Patient is being discharged from the Urgent Care and sent to the Emergency Department via private vehicel . Per R. Izola Price PA, patient is in need of higher level of care due to generalized weakness. Patient is aware and verbalizes understanding of plan of care.  Vitals:   01/23/23 1209 01/23/23 1211  BP:  (!) 113/54  Pulse: 89   Resp: 16   Temp: 98.5 F (36.9 C)   SpO2: 100%

## 2023-01-26 LAB — CULTURE, BLOOD (ROUTINE X 2)
Culture: NO GROWTH
Culture: NO GROWTH
Special Requests: ADEQUATE

## 2023-02-14 ENCOUNTER — Ambulatory Visit (INDEPENDENT_AMBULATORY_CARE_PROVIDER_SITE_OTHER): Payer: Medicare PPO | Admitting: Family

## 2023-02-14 ENCOUNTER — Encounter: Payer: Self-pay | Admitting: Family

## 2023-02-14 VITALS — BP 120/65 | HR 88 | Temp 98.0°F | Ht 70.0 in | Wt 217.6 lb

## 2023-02-14 DIAGNOSIS — Z1329 Encounter for screening for other suspected endocrine disorder: Secondary | ICD-10-CM

## 2023-02-14 DIAGNOSIS — E559 Vitamin D deficiency, unspecified: Secondary | ICD-10-CM | POA: Diagnosis not present

## 2023-02-14 DIAGNOSIS — R7303 Prediabetes: Secondary | ICD-10-CM

## 2023-02-14 DIAGNOSIS — E785 Hyperlipidemia, unspecified: Secondary | ICD-10-CM

## 2023-02-14 DIAGNOSIS — R399 Unspecified symptoms and signs involving the genitourinary system: Secondary | ICD-10-CM | POA: Diagnosis not present

## 2023-02-14 DIAGNOSIS — Z130 Encounter for screening for diseases of the blood and blood-forming organs and certain disorders involving the immune mechanism: Secondary | ICD-10-CM

## 2023-02-14 DIAGNOSIS — Z23 Encounter for immunization: Secondary | ICD-10-CM | POA: Diagnosis not present

## 2023-02-14 DIAGNOSIS — Z13228 Encounter for screening for other metabolic disorders: Secondary | ICD-10-CM

## 2023-02-14 MED ORDER — ATORVASTATIN CALCIUM 40 MG PO TABS: 40.0000 mg | ORAL_TABLET | Freq: Every day | ORAL | 0 refills | Status: DC

## 2023-02-14 NOTE — Progress Notes (Signed)
 Patient ID: Thomas Moody, male    DOB: 01-Nov-1955  MRN: [id number]  CC: Urgent Care Follow-Up  Subjective: Mithcell Schumpert is a 68 y.o. male who presents for Urgent Care follow-up.   His concerns today include:  Patient seen at [facility] Emergency Department at [facility] on 01/20/2023 -01/21/2023 for fever and myalgia. Patient subsequently seen at [facility] at [facility]) on 01/23/2023 for generalized weakness and was recommended patient report to Emergency Department for evaluation. Patient states he did not go to the Emergency Department because he didn't feel it was an emergency situation. Today patient states since then feeling improved and back to normal. States he is no longer having hand/feet cramping. States he noticed that some of his labs during the Emergency Department and Urgent Care visits were not normal and would like to have rechecked on today. No further issues/concerns for discussion today.    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     CINNAMON PO Take 1 tablet by mouth daily.     meloxicam  (MOBIC ) 7.5 MG tablet Take 1 tablet (7.5 mg total) by mouth daily. 30 tablet 2   tamsulosin  (FLOMAX ) 0.4 MG CAPS capsule Take 1 capsule (0.4 mg total) by mouth daily. 90 capsule 3   triamcinolone  cream (KENALOG ) 0.1 % Apply 1 Application topically 2 (two) times daily. 80 g 0   Vitamin D , Ergocalciferol , (DRISDOL ) 1.25 MG (50000 UNIT) CAPS capsule TAKE 1 CAPSULE BY MOUTH EVERY 7 DAYS 4  capsule 2   predniSONE  (STERAPRED UNI-PAK 21 TAB) 10 MG (21) TBPK tablet Take as directed (Patient not taking: Reported on 02/14/2023) 21 tablet 3   No current facility-administered medications on file prior to visit.    No Known Allergies  Social History   Socioeconomic History   Marital status: Married    Spouse name: Not on file   Number of children: Not on file   Years of education: Not on file   Highest education level: Associate degree: occupational, scientist, product/process development, or vocational program  Occupational History   Not on file  Tobacco Use   Smoking status: Never    Passive exposure: Never   Smokeless tobacco: Never  Vaping Use   Vaping status: Never Used  Substance and Sexual Activity   Alcohol use: Not Currently    Alcohol/week: 0.0 standard drinks of alcohol    Comment: occ   Drug use: No   Sexual activity: Yes  Other Topics Concern   Not on file  Social History Narrative   Married; 3 sons   Social Drivers of Corporate Investment Banker Strain: Medium Risk (02/12/2023)   Overall Financial Resource Strain (CARDIA)    Difficulty of Paying Living Expenses: Somewhat hard  Food Insecurity: No Food Insecurity (02/12/2023)   Hunger Vital Sign    Worried About Running Out of Food in the Last Year: Never true    Ran Out of Food in the Last Year: Never true  Transportation Needs: No Transportation Needs (02/12/2023)   PRAPARE -  Administrator, Civil Service (Medical): No    Lack of Transportation (Non-Medical): No  Physical Activity: Insufficiently Active (02/12/2023)   Exercise Vital Sign    Days of Exercise per Week: 1 day    Minutes of Exercise per Session: 20 min  Stress: No Stress Concern Present (02/12/2023)   [employer] of Occupational Health - Occupational Stress Questionnaire    Feeling of Stress : Only a little  Social Connections: Moderately Isolated (02/12/2023)   Social Connection and Isolation Panel [NHANES]    Frequency of Communication with Friends  and Family: More than three times a week    Frequency of Social Gatherings with Friends and Family: Once a week    Attends Religious Services: Never    Database Administrator or Organizations: No    Attends Engineer, Structural: Not on file    Marital Status: Married  Catering Manager Violence: Not on file    Family History  Problem Relation Age of Onset   Heart disease Mother    Dementia Father    Hypertension Sister    Diabetes Son    Diabetes type I Son    Diabetes Paternal Grandmother    Diabetes Son    Diabetes type II Son    Autism Son        middle son    Past Surgical History:  Procedure Laterality Date   PILONIDAL CYST EXCISION      ROS: Review of Systems Negative except as stated above  PHYSICAL EXAM: BP 120/65   Pulse 88   Temp 98 F (36.7 C) (Oral)   Ht 5' 10 (1.778 m)   Wt 217 lb 9.6 oz (98.7 kg)   SpO2 95%   BMI 31.22 kg/m   Physical Exam HENT:     Head: Normocephalic and atraumatic.     Nose: Nose normal.     Mouth/Throat:     Mouth: Mucous membranes are moist.     Pharynx: Oropharynx is clear.  Eyes:     Extraocular Movements: Extraocular movements intact.     Conjunctiva/sclera: Conjunctivae normal.     Pupils: Pupils are equal, round, and reactive to light.  Cardiovascular:     Rate and Rhythm: Normal rate and regular rhythm.     Pulses: Normal pulses.     Heart sounds: Normal heart sounds.  Pulmonary:     Effort: Pulmonary effort is normal.     Breath sounds: Normal breath sounds.  Musculoskeletal:        General: Normal range of motion.     Cervical back: Normal range of motion and neck supple.  Neurological:     General: No focal deficit present.     Mental Status: He is alert and oriented to person, place, and time.  Psychiatric:        Mood and Affect: Mood normal.        Behavior: Behavior normal.     ASSESSMENT AND PLAN: 1. Prediabetes (Primary) - Routine screening.  - Hemoglobin A1c  2. Hyperlipidemia,  unspecified hyperlipidemia type - Continue Atorvastatin  as prescribed. Counseled on medication adherence/adverse effects.  - Routine screening.  - Follow-up with primary provider as scheduled.  - Lipid panel - atorvastatin  (LIPITOR) 40 MG tablet; Take 1 tablet (40 mg total) by mouth daily.  Dispense: 90 tablet; Refill: 0  3. Vitamin D  deficiency - Routine screening.  - Vitamin D , 25-hydroxy  4. Screening for metabolic disorder - Routine screening.  - CMP14+EGFR  5. Screening for deficiency anemia - Routine screening.  - CBC  6. Thyroid  disorder screen - Routine screening.  - TSH  7. Lower urinary tract symptoms (LUTS) - Routine screening.  - POCT URINALYSIS DIP (CLINITEK); Future - Urine Culture   Patient was given the opportunity to ask questions.  Patient verbalized understanding of the plan and was able to repeat key elements of the plan. Patient was given clear instructions to go to Emergency Department or return to medical center if symptoms don't improve, worsen, or new problems develop.The patient verbalized understanding.   Orders Placed This Encounter  Procedures   Urine Culture   CMP14+EGFR   Vitamin D , 25-hydroxy   Hemoglobin A1c   TSH   Lipid panel   CBC   POCT URINALYSIS DIP (CLINITEK)     Requested Prescriptions   Signed Prescriptions Disp Refills   atorvastatin  (LIPITOR) 40 MG tablet 90 tablet 0    Sig: Take 1 tablet (40 mg total) by mouth daily.    No follow-ups on file.  Greig JINNY Drones, NP

## 2023-02-14 NOTE — Progress Notes (Signed)
 Patient states he had blood test done and when he saw the results things were not in normal limits and states he figured that's why he was brought in today.   Wants Flu vaccine.

## 2023-02-15 LAB — CMP14+EGFR
ALT: 38 [IU]/L (ref 0–44)
AST: 23 [IU]/L (ref 0–40)
Albumin: 4.2 g/dL (ref 3.9–4.9)
Alkaline Phosphatase: 75 [IU]/L (ref 44–121)
BUN/Creatinine Ratio: 8 — ABNORMAL LOW (ref 10–24)
BUN: 10 mg/dL (ref 8–27)
Bilirubin Total: 0.6 mg/dL (ref 0.0–1.2)
CO2: 25 mmol/L (ref 20–29)
Calcium: 9.7 mg/dL (ref 8.6–10.2)
Chloride: 104 mmol/L (ref 96–106)
Creatinine, Ser: 1.18 mg/dL (ref 0.76–1.27)
Globulin, Total: 2.5 g/dL (ref 1.5–4.5)
Glucose: 90 mg/dL (ref 70–99)
Potassium: 4.2 mmol/L (ref 3.5–5.2)
Sodium: 141 mmol/L (ref 134–144)
Total Protein: 6.7 g/dL (ref 6.0–8.5)
eGFR: 67 mL/min/{1.73_m2} (ref >59)

## 2023-02-15 LAB — LIPID PANEL
Chol/HDL Ratio: 2.4 {ratio} (ref 0.0–5.0)
Cholesterol, Total: 134 mg/dL (ref 100–199)
HDL: 57 mg/dL (ref >39)
LDL Chol Calc (NIH): 58 mg/dL (ref 0–99)
Triglycerides: 101 mg/dL (ref 0–149)
VLDL Cholesterol Cal: 19 mg/dL (ref 5–40)

## 2023-02-15 LAB — HEMOGLOBIN A1C
Est. average glucose Bld gHb Est-mCnc: 120 mg/dL
Hgb A1c MFr Bld: 5.8 % — ABNORMAL HIGH (ref 4.8–5.6)

## 2023-02-15 LAB — CBC
Hematocrit: 44.8 % (ref 37.5–51.0)
Hemoglobin: 14.7 g/dL (ref 13.0–17.7)
MCH: 30.5 pg (ref 26.6–33.0)
MCHC: 32.8 g/dL (ref 31.5–35.7)
MCV: 93 fL (ref 79–97)
Platelets: 234 10*3/uL (ref 150–450)
RBC: 4.82 x10E6/uL (ref 4.14–5.80)
RDW: 14.6 % (ref 11.6–15.4)
WBC: 3.1 10*3/uL — ABNORMAL LOW (ref 3.4–10.8)

## 2023-02-15 LAB — TSH: TSH: 1.41 u[IU]/mL (ref 0.450–4.500)

## 2023-02-15 LAB — VITAMIN D 25 HYDROXY (VIT D DEFICIENCY, FRACTURES): Vit D, 25-Hydroxy: 25.1 ng/mL — ABNORMAL LOW (ref 30.0–100.0)

## 2023-02-17 ENCOUNTER — Other Ambulatory Visit: Payer: Self-pay | Admitting: Family

## 2023-02-17 DIAGNOSIS — D72819 Decreased white blood cell count, unspecified: Secondary | ICD-10-CM

## 2023-02-17 DIAGNOSIS — E559 Vitamin D deficiency, unspecified: Secondary | ICD-10-CM

## 2023-02-17 MED ORDER — VITAMIN D (ERGOCALCIFEROL) 1.25 MG (50000 UNIT) PO CAPS: 50000.0000 [IU] | ORAL_CAPSULE | ORAL | 0 refills | Status: AC

## 2023-03-11 NOTE — Progress Notes (Unsigned)
[facility] [phone]   [phone]  CONSULT NOTE  REFERRING PHYSICIAN: Ricky Stabs NP  REASON FOR CONSULTATION:  Leukopenia  HPI Thomas Moody is a 68 y.o. male with a past medical history significant for osteoarthritis, hyperlipidemia, enlarged prostate, and prediabetes is referred to the clinic for leukocytopenia.  The patient presented to an urgent care/ER for generalized bodyaches in December 2024 in addition to fatigue and subjective fevers. His workup was reassuring UA without infection, lactic acid negative, viral panel negative for COVID, influenza, or RSV.  X-ray without any evidence of pneumonia.  CT of the abdomen pelvis negative for any acute intra-abdominal inflammatory or infectious process.  He was given IV fluids.  He had some elevated LFTs.  The patient had known leukocytopenia, some worsening when in the emergency room (white blood cell count 1.9).  He went to an urgent care for the same concern on 01/23/2023.  He followed up with his PCP on 02/14/2023 because he was concerned about the labs performed in the ER/urgent care. He was feeling better at that time.  He had repeat labs that showed persistent leukopenia with a total white blood cell count at 3.1.   To the patient's knowledge, he did not know that he had a history of leukopenia.  Per chart review, it appears his WBC was low since at least 2016. That is the olest record I have. His WBC is typically in the 2.7-3.4 range.    Otherwise he is feeling "fine" today.  Regarding medications, he is on Lipitor which he believes he was on for at least 10 years.  He takes a baby aspirin.  He also put cinnamon in his tea.  He has been taking saw palmetto for his prostate for the last 6 or 7 months.  He takes Mobic for low back pain approximately 3-4 times per week.   He does not get sick frequently with the exception of the illness last month that prompted the urgent care evaluation.   Denies any abnormal bleeding or bruising any epistaxis, gingival bleeding, hemoptysis, hematemesis, melena, or hematochezia.  Denies any blood thinners except for a baby aspirin.  He denies any particular dietary habits such as being a vegan or vegetarian, except he does try to avoid pork. He denies any history of nutritional deficiencies besides vitamin D.  Denies any personal history of autoimmune disorders. Denies any fever, chills, night sweats, or lymphadenopathy. He may get night sweats if he has on a lot of blankets. He denies unexplained weight loss.  Denies any history of hepatitis or HIV. He denies any current signs of infection such as diarrhea, skin infections, sore throat, nasal congestion, or dysuria.  Family history consist of a mother who had stroke, diabetes, and hypertension.  His father had thyroid dysfunction.  His brother has hypertension.  His other brother has ichthyosis.   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  He is married and has 3 children.  He denies any smoking history.  He has not had any alcohol since leaving the [employer] in 2006.  He sometimes may smoke marijuana recreationally.    HPI  Past Medical History:  Diagnosis Date   Asthma    GERD (gastroesophageal reflux disease)     Past Surgical History:  Procedure Laterality Date   PILONIDAL CYST EXCISION      Family History  Problem Relation  Age of Onset   Heart disease Mother    Dementia Father    Hypertension Sister    Diabetes Son    Diabetes type I Son    Diabetes Paternal Grandmother    Diabetes Son    Diabetes type II Son    Autism Son        middle son    Social History Social History   Tobacco Use   Smoking status: Never    Passive exposure: Never   Smokeless tobacco: Never  Vaping Use   Vaping status: Never Used  Substance Use Topics   Alcohol use: Not Currently    Alcohol/week: 0.0 standard drinks of  alcohol    Comment: occ   Drug use: No    No Known Allergies  Current Outpatient Medications  Medication Sig Dispense Refill   aspirin 81 MG tablet Take 81 mg by mouth daily.     atorvastatin (LIPITOR) 40 MG tablet Take 1 tablet (40 mg total) by mouth daily. 90 tablet 0   CINNAMON PO Take 1 tablet by mouth daily.     meloxicam (MOBIC) 7.5 MG tablet Take 1 tablet (7.5 mg total) by mouth daily. 30 tablet 2   tamsulosin (FLOMAX) 0.4 MG CAPS capsule Take 1 capsule (0.4 mg total) by mouth daily. 90 capsule 3   triamcinolone cream (KENALOG) 0.1 % Apply 1 Application topically 2 (two) times daily. 80 g 0   Vitamin D, Ergocalciferol, (DRISDOL) 1.25 MG (50000 UNIT) CAPS capsule Take 1 capsule (50,000 Units total) by mouth every 7 (seven) days for 12 doses. 12 capsule 0   predniSONE (STERAPRED UNI-PAK 21 TAB) 10 MG (21) TBPK tablet Take as directed (Patient not taking: Reported on 03/12/2023) 21 tablet 3   No current facility-administered medications for this visit.    REVIEW OF SYSTEMS:   Review of Systems  Constitutional: Negative for appetite change, chills, fatigue, fever and unexpected weight change.  HENT: Negative for mouth sores, nosebleeds, sore throat and trouble swallowing.   Eyes: Negative for eye problems and icterus.  Respiratory: Negative for cough, hemoptysis, shortness of breath and wheezing.   Cardiovascular: Negative for chest pain and leg swelling.  Gastrointestinal: Negative for abdominal pain, constipation, diarrhea, nausea and vomiting.  Genitourinary: Negative for bladder incontinence, difficulty urinating, dysuria, frequency and hematuria.   Musculoskeletal: Negative for back pain, gait problem, neck pain and neck stiffness.  Skin: Negative for itching and rash.  Neurological: Negative for dizziness, extremity weakness, gait problem, headaches, light-headedness and seizures.  Hematological: Negative for adenopathy. Does not bruise/bleed easily.  Psychiatric/Behavioral:  Negative for confusion, depression and sleep disturbance. The patient is not nervous/anxious.     PHYSICAL EXAMINATION:  Blood pressure 139/74, pulse 91, temperature 98.7 F (37.1 C), temperature source Temporal, resp. rate 16, weight 216 lb 3.2 oz (98.1 kg), SpO2 97%.  ECOG PERFORMANCE STATUS: 0  Physical Exam  Constitutional: Oriented to person, place, and time and well-developed, well-nourished, and in no distress. HENT:  Head: Normocephalic and atraumatic.  Mouth/Throat: Oropharynx is clear and moist. No oropharyngeal exudate.  Eyes: Conjunctivae are normal. Right eye exhibits no discharge. Left eye exhibits no discharge. No scleral icterus.  Neck: Normal range of motion. Neck supple.  Cardiovascular: Normal rate, regular rhythm, normal heart sounds and intact distal pulses.   Pulmonary/Chest: Effort normal and breath sounds normal. No respiratory distress. No wheezes. No rales.  Abdominal: Soft. Bowel sounds are normal. Exhibits no distension and no mass. There is no tenderness.  Musculoskeletal:  Normal range of motion. Exhibits no edema.  Lymphadenopathy:    No cervical adenopathy.  Neurological: Alert and oriented to person, place, and time. Exhibits normal muscle tone. Gait normal. Coordination normal.  Skin: Skin is warm and dry. No rash noted. Not diaphoretic. No erythema. No pallor.  Psychiatric: Mood, memory and judgment normal.  Vitals reviewed.  LABORATORY DATA: Lab Results  Component Value Date   WBC 2.1 (L) 03/12/2023   HGB 14.4 03/12/2023   HCT 42.0 03/12/2023   MCV 88.1 03/12/2023   PLT 209 03/12/2023      Chemistry      Component Value Date/Time   NA 141 03/12/2023 1310   NA 141 02/14/2023 1106   K 3.6 03/12/2023 1310   CL 110 03/12/2023 1310   CO2 26 03/12/2023 1310   BUN 13 03/12/2023 1310   BUN 10 02/14/2023 1106   CREATININE 1.00 03/12/2023 1310   CREATININE 1.18 04/08/2014 1211      Component Value Date/Time   CALCIUM 9.2 03/12/2023 1310    ALKPHOS 50 03/12/2023 1310   AST 20 03/12/2023 1310   ALT 31 03/12/2023 1310   BILITOT 0.5 03/12/2023 1310       RADIOGRAPHIC STUDIES: No results found.  ASSESSMENT: This is a very pleasant 68 year old African-American male referred to the clinic for leukopenia.    The patient was seen with Dr. Arbutus Ped today.  The patient several lab studies performed today including a CBC, CMP, LDH, vitamin B12, folate, hepatitis panel, HIV testing, rheumatoid factor, and ANA.  His white blood cell count is low at 2.1.  His ANC is 0.9. His CMP is unremarkable.  His LDH is normal.  His B12 and folate are normal. His other lab studies are pending at this time.   We will wait for the remaining lab studies before determining the next steps in his care.  If no clear etiology of his leukocytopenia, Dr. Arbutus Ped discussed he likely will require a bone marrow biopsy and aspirate for further evaluation. I have placed the order.   We will see him back for repeat CBC and follow up in 1 month to review the results of the bone marrow biopsy. Of course, if there are any abnormalities or further instructions that are required based on the pending labs studies, I will call the patient sooner.   The patient voices understanding of current disease status and treatment options and is in agreement with the current care plan.  All questions were answered. The patient knows to call the clinic with any problems, questions or concerns. We can certainly see the patient much sooner if necessary.  Thank you so much for allowing me to participate in the care of Thomas Moody. I will continue to follow up the patient with you and assist in his care.   Disclaimer: This note was dictated with voice recognition software. Similar sounding words can inadvertently be transcribed and may not be corrected upon review.   Thomas Moody L Thomas Moody March 12, 2023, 3:41 PM  ADDENDUM: Hematology/Oncology Attending: I had a  face-to-face encounter with the patient today.  I reviewed his record, lab and recommended his care plan.  This is a very pleasant 68 years old African-American male with past medical history significant for osteoarthritis, dyslipidemia, enlarged prostate as well as prediabetes.  The patient was seen recently at one of the urgent care center complaining of generalized body ache in December 2024 with subjective fever and fatigue.  During his evaluation he had viral  panel performed that was negative.  Imaging studies including chest x-ray showed no acute cardiopulmonary condition and CT of the abdomen and pelvis was negative.  During his evaluation he was found to have leukocytopenia that has been getting worse down to white blood count of 1.9.  He was seen by his primary care provider and repeat CBC showed persistent leukocytopenia with total white blood count of 3.1 on February 14, 2023.  The patient was referred to [place] today for evaluation and recommendation regarding his condition. Reviewing his previous records the patient had history of leukocytopenia that has been going on for several years and it has always in the range of 2.5-3.5. I had a lengthy discussion with the patient today about his condition.  This is likely to be drug-induced versus immune mediated leukocytopenia but underlying bone marrow biopsy abnormality cannot be completely ruled out.  Because of the persistent leukocytopenia over several years, I recommended for the patient to proceed with a bone marrow biopsy and aspirate to rule out any bone marrow abnormality and if negative then we will continue to monitor closely with no further management. The patient is in agreement with the current plan. We will see him back for follow-up visit in 1 months for evaluation and discussion of his biopsy results and further recommendation regarding his condition. He was advised to call immediately if he has any other concerning symptoms in the  interval. The total time spent in the appointment was 60 minutes. Disclaimer: This note was dictated with voice recognition software. Similar sounding words can inadvertently be transcribed and may be missed upon review. Lajuana Matte, MD

## 2023-03-12 ENCOUNTER — Other Ambulatory Visit: Payer: Medicare PPO

## 2023-03-12 ENCOUNTER — Other Ambulatory Visit: Payer: Self-pay | Admitting: Physician Assistant

## 2023-03-12 ENCOUNTER — Inpatient Hospital Stay: Payer: Medicare PPO | Attending: Physician Assistant | Admitting: Physician Assistant

## 2023-03-12 ENCOUNTER — Inpatient Hospital Stay: Payer: Medicare PPO

## 2023-03-12 VITALS — BP 139/74 | HR 91 | Temp 98.7°F | Resp 16 | Wt 216.2 lb

## 2023-03-12 DIAGNOSIS — R5383 Other fatigue: Secondary | ICD-10-CM | POA: Diagnosis not present

## 2023-03-12 DIAGNOSIS — E785 Hyperlipidemia, unspecified: Secondary | ICD-10-CM | POA: Diagnosis not present

## 2023-03-12 DIAGNOSIS — D72819 Decreased white blood cell count, unspecified: Secondary | ICD-10-CM

## 2023-03-12 DIAGNOSIS — R61 Generalized hyperhidrosis: Secondary | ICD-10-CM | POA: Diagnosis not present

## 2023-03-12 DIAGNOSIS — Z823 Family history of stroke: Secondary | ICD-10-CM | POA: Diagnosis not present

## 2023-03-12 DIAGNOSIS — D709 Neutropenia, unspecified: Secondary | ICD-10-CM | POA: Insufficient documentation

## 2023-03-12 DIAGNOSIS — M545 Low back pain, unspecified: Secondary | ICD-10-CM | POA: Diagnosis not present

## 2023-03-12 DIAGNOSIS — Z818 Family history of other mental and behavioral disorders: Secondary | ICD-10-CM

## 2023-03-12 DIAGNOSIS — Z833 Family history of diabetes mellitus: Secondary | ICD-10-CM | POA: Diagnosis not present

## 2023-03-12 DIAGNOSIS — R7989 Other specified abnormal findings of blood chemistry: Secondary | ICD-10-CM | POA: Insufficient documentation

## 2023-03-12 DIAGNOSIS — Z8349 Family history of other endocrine, nutritional and metabolic diseases: Secondary | ICD-10-CM

## 2023-03-12 DIAGNOSIS — Z8249 Family history of ischemic heart disease and other diseases of the circulatory system: Secondary | ICD-10-CM

## 2023-03-12 DIAGNOSIS — R7303 Prediabetes: Secondary | ICD-10-CM | POA: Diagnosis not present

## 2023-03-12 DIAGNOSIS — M1990 Unspecified osteoarthritis, unspecified site: Secondary | ICD-10-CM | POA: Diagnosis not present

## 2023-03-12 DIAGNOSIS — D72829 Elevated white blood cell count, unspecified: Secondary | ICD-10-CM | POA: Diagnosis not present

## 2023-03-12 DIAGNOSIS — R509 Fever, unspecified: Secondary | ICD-10-CM | POA: Diagnosis not present

## 2023-03-12 DIAGNOSIS — Z79899 Other long term (current) drug therapy: Secondary | ICD-10-CM

## 2023-03-12 DIAGNOSIS — N400 Enlarged prostate without lower urinary tract symptoms: Secondary | ICD-10-CM

## 2023-03-12 LAB — CMP (CANCER CENTER ONLY)
ALT: 31 U/L (ref 0–44)
AST: 20 U/L (ref 15–41)
Albumin: 4.1 g/dL (ref 3.5–5.0)
Alkaline Phosphatase: 50 U/L (ref 38–126)
Anion gap: 5 (ref 5–15)
BUN: 13 mg/dL (ref 8–23)
CO2: 26 mmol/L (ref 22–32)
Calcium: 9.2 mg/dL (ref 8.9–10.3)
Chloride: 110 mmol/L (ref 98–111)
Creatinine: 1 mg/dL (ref 0.61–1.24)
GFR, Estimated: >60 mL/min (ref >60)
Glucose, Bld: 101 mg/dL — ABNORMAL HIGH (ref 70–99)
Potassium: 3.6 mmol/L (ref 3.5–5.1)
Sodium: 141 mmol/L (ref 135–145)
Total Bilirubin: 0.5 mg/dL (ref 0.0–1.2)
Total Protein: 6.8 g/dL (ref 6.5–8.1)

## 2023-03-12 LAB — HIV ANTIBODY (ROUTINE TESTING W REFLEX): HIV Screen 4th Generation wRfx: NONREACTIVE

## 2023-03-12 LAB — CBC WITH DIFFERENTIAL (CANCER CENTER ONLY)
Abs Immature Granulocytes: 0 10*3/uL (ref 0.00–0.07)
Basophils Absolute: 0.1 10*3/uL (ref 0.0–0.1)
Basophils Relative: 2 %
Eosinophils Absolute: 0.3 10*3/uL (ref 0.0–0.5)
Eosinophils Relative: 14 %
HCT: 42 % (ref 39.0–52.0)
Hemoglobin: 14.4 g/dL (ref 13.0–17.0)
Immature Granulocytes: 0 %
Lymphocytes Relative: 22 %
Lymphs Abs: 0.5 10*3/uL — ABNORMAL LOW (ref 0.7–4.0)
MCH: 30.2 pg (ref 26.0–34.0)
MCHC: 34.3 g/dL (ref 30.0–36.0)
MCV: 88.1 fL (ref 80.0–100.0)
Monocytes Absolute: 0.4 10*3/uL (ref 0.1–1.0)
Monocytes Relative: 18 %
Neutro Abs: 0.9 10*3/uL — ABNORMAL LOW (ref 1.7–7.7)
Neutrophils Relative %: 44 %
Platelet Count: 209 10*3/uL (ref 150–400)
RBC: 4.77 MIL/uL (ref 4.22–5.81)
RDW: 14.7 % (ref 11.5–15.5)
WBC Count: 2.1 10*3/uL — ABNORMAL LOW (ref 4.0–10.5)
nRBC: 0 % (ref 0.0–0.2)

## 2023-03-12 LAB — FOLATE: Folate: 16.4 ng/mL (ref >5.9)

## 2023-03-12 LAB — VITAMIN B12: Vitamin B-12: 383 pg/mL (ref 180–914)

## 2023-03-12 LAB — HEPATITIS PANEL, ACUTE
HCV Ab: NONREACTIVE
Hep A IgM: NONREACTIVE
Hep B C IgM: NONREACTIVE
Hepatitis B Surface Ag: NONREACTIVE

## 2023-03-12 LAB — LACTATE DEHYDROGENASE: LDH: 164 U/L (ref 98–192)

## 2023-03-13 LAB — RHEUMATOID FACTOR: Rheumatoid fact SerPl-aCnc: <10 [IU]/mL (ref <14.0)

## 2023-03-13 LAB — ANTINUCLEAR ANTIBODIES, IFA: ANA Ab, IFA: NEGATIVE

## 2023-03-20 ENCOUNTER — Encounter (INDEPENDENT_AMBULATORY_CARE_PROVIDER_SITE_OTHER): Payer: Medicare PPO | Admitting: Family

## 2023-03-20 ENCOUNTER — Telehealth: Payer: Self-pay | Admitting: Family

## 2023-03-20 NOTE — Telephone Encounter (Signed)
 Called pt to reschedule missed appt; could not leave vm due to full vm box

## 2023-03-20 NOTE — Progress Notes (Signed)
 Erroneous encounter-disregard

## 2023-04-03 ENCOUNTER — Other Ambulatory Visit: Payer: Self-pay | Admitting: Radiology

## 2023-04-03 DIAGNOSIS — D709 Neutropenia, unspecified: Secondary | ICD-10-CM

## 2023-04-03 NOTE — H&P (Incomplete)
Chief Complaint: Persistent leukocytopenia; referred for image guided bone marrow biopsy for further evaluation  Referring Provider(s): Mohamed,M  Supervising Physician: Roanna Banning  Patient Status: [facility] - Out-pt  History of Present Illness: Thomas Moody is a 68 y.o. male with past medical history significant for osteoarthritis, dyslipidemia, asthma, GERD, enlarged prostate as well as prediabetes. The patient was seen recently at one of the urgent care centers complaining of generalized body ache in December 2024 with subjective fever and fatigue. During his evaluation he had viral panel performed that was negative. Imaging studies including chest x-ray showed no acute cardiopulmonary condition and CT of the abdomen and pelvis was negative. During his evaluation he was found to have leukocytopenia that has been getting worse down to white blood count of 1.9. He was seen by his primary care provider and repeat CBC showed persistent leukocytopenia with total white blood count of 3.1 on February 14, 2023 . He is scheduled today for image guided bone marrow biopsy for further evaluation.   *** Patient is Full Code  Past Medical History:  Diagnosis Date   Asthma    GERD (gastroesophageal reflux disease)     Past Surgical History:  Procedure Laterality Date   PILONIDAL CYST EXCISION      Allergies: Patient has no known allergies.  Medications: Prior to Admission medications   Medication Sig Start Date End Date Taking? Authorizing Provider  aspirin 81 MG tablet Take 81 mg by mouth daily.    [provider]  atorvastatin (LIPITOR) 40 MG tablet Take 1 tablet (40 mg total) by mouth daily. 02/14/23   Rema Fendt, NP  CINNAMON PO Take 1 tablet by mouth daily.    [provider]  meloxicam (MOBIC) 7.5 MG tablet Take 1 tablet (7.5 mg total) by mouth daily. 01/11/22   Rema Fendt, NP  predniSONE (STERAPRED UNI-PAK 21 TAB) 10 MG (21) TBPK tablet Take as  directed Patient not taking: Reported on 03/12/2023 08/21/21   Tarry Kos, MD  tamsulosin (FLOMAX) 0.4 MG CAPS capsule Take 1 capsule (0.4 mg total) by mouth daily. 03/27/22   Joline Maxcy, MD  triamcinolone cream (KENALOG) 0.1 % Apply 1 Application topically 2 (two) times daily. 11/27/21   Rema Fendt, NP  Vitamin D, Ergocalciferol, (DRISDOL) 1.25 MG (50000 UNIT) CAPS capsule Take 1 capsule (50,000 Units total) by mouth every 7 (seven) days for 12 doses. 02/17/23 05/06/23  Rema Fendt, NP     Family History  Problem Relation Age of Onset   Heart disease Mother    Dementia Father    Hypertension Sister    Diabetes Son    Diabetes type I Son    Diabetes Paternal Grandmother    Diabetes Son    Diabetes type II Son    Autism Son        middle son    Social History   Socioeconomic History   Marital status: Married    Spouse name: Not on file   Number of children: Not on file   Years of education: Not on file   Highest education level: Associate degree: occupational, Scientist, product/process development, or vocational program  Occupational History   Not on file  Tobacco Use   Smoking status: Never    Passive exposure: Never   Smokeless tobacco: Never  Vaping Use   Vaping status: Never Used  Substance and Sexual Activity   Alcohol use: Not Currently    Alcohol/week: 0.0 standard drinks of alcohol  Comment: occ   Drug use: No   Sexual activity: Yes  Other Topics Concern   Not on file  Social History Narrative   Married; 3 sons   Social Drivers of Corporate investment banker Strain: Medium Risk (02/12/2023)   Overall Financial Resource Strain (CARDIA)    Difficulty of Paying Living Expenses: Somewhat hard  Food Insecurity: No Food Insecurity (02/12/2023)   Hunger Vital Sign    Worried About Running Out of Food in the Last Year: Never true    Ran Out of Food in the Last Year: Never true  Transportation Needs: No Transportation Needs (02/12/2023)   PRAPARE - Scientist, research (physical sciences) (Medical): No    Lack of Transportation (Non-Medical): No  Physical Activity: Insufficiently Active (02/12/2023)   Exercise Vital Sign    Days of Exercise per Week: 1 day    Minutes of Exercise per Session: 20 min  Stress: No Stress Concern Present (02/12/2023)   [employer] of Occupational Health - Occupational Stress Questionnaire    Feeling of Stress : Only a little  Social Connections: Moderately Isolated (02/12/2023)   Social Connection and Isolation Panel [NHANES]    Frequency of Communication with Friends and Family: More than three times a week    Frequency of Social Gatherings with Friends and Family: Once a week    Attends Religious Services: Never    Database administrator or Organizations: No    Attends Engineer, structural: Not on file    Marital Status: Married       Review of Systems  Vital Signs:   Advance Care Plan: no documents on file  Physical Exam  Imaging: No results found.  Labs:  CBC: Recent Labs    01/20/23 2104 02/14/23 1106 03/12/23 1310  WBC 1.9* 3.1* 2.1*  HGB 14.1 14.7 14.4  HCT 41.0 44.8 42.0  PLT 182 234 209    COAGS: No results for input(s): "INR", "APTT" in the last 8760 hours.  BMP: Recent Labs    01/20/23 2104 01/21/23 0249 02/14/23 1106 03/12/23 1310  NA 131* 130* 141 141  K 3.4* 3.3* 4.2 3.6  CL 101 101 104 110  CO2 22 23 25 26   GLUCOSE 106* 98 90 101*  BUN 15 14 10 13   CALCIUM 8.2* 7.7* 9.7 9.2  CREATININE 1.25* 1.06 1.18 1.00  GFRNONAA >60 >60  --  >60    LIVER FUNCTION TESTS: Recent Labs    01/20/23 2104 01/21/23 0249 02/14/23 1106 03/12/23 1310  BILITOT 1.0 1.1 0.6 0.5  AST 156* 124* 23 20  ALT 210* 174* 38 31  ALKPHOS 42 38 75 50  PROT 7.2 6.3* 6.7 6.8  ALBUMIN 3.6 3.1* 4.2 4.1    TUMOR MARKERS: No results for input(s): "AFPTM", "CEA", "CA199", "CHROMGRNA" in the last 8760 hours.  Assessment and Plan: 68 y.o. male with past medical history significant for  osteoarthritis, dyslipidemia, asthma, GERD, enlarged prostate as well as prediabetes. He now presents with persistent leukocytopenia of uncertain etiology and is scheduled today for image guided bone marrow biopsy for further evaluation. Risks and benefits of procedure was discussed with the patient including, but not limited to bleeding, infection, damage to adjacent structures or low yield requiring additional tests.  All of the questions were answered and there is agreement to proceed.  Consent signed and in chart.    Thank you for allowing our service to participate in Thomas Moody 's care.  Electronically Signed: D. Jeananne Rama, PA-C   04/03/2023, 3:19 PM      I spent a total of  15 Minutes   in face to face in clinical consultation, greater than 50% of which was counseling/coordinating care for CT guided bone marrow biopsy

## 2023-04-04 ENCOUNTER — Ambulatory Visit (HOSPITAL_COMMUNITY)
Admission: RE | Admit: 2023-04-04 | Discharge: 2023-04-04 | Disposition: A | Discharge status: Home / Self Care | Payer: Medicare PPO | Source: Ambulatory Visit | Attending: Physician Assistant | Admitting: Physician Assistant

## 2023-04-04 ENCOUNTER — Telehealth: Payer: Self-pay | Admitting: Physician Assistant

## 2023-04-04 ENCOUNTER — Ambulatory Visit (HOSPITAL_COMMUNITY): Payer: Medicare PPO

## 2023-04-04 NOTE — Telephone Encounter (Signed)
I called the patient and let him know we will reschedule his follow up after his biopsy. He missed his appointment that was scheduled for today and it has been rescheduled to 04/25/23. He knows we will cancel the office visit with Dr. Arbutus Ped on 2/26 and will reschedule this around 3/20 or the following week. I have reached out to scheduling who will call him with new appointment time

## 2023-04-09 ENCOUNTER — Other Ambulatory Visit: Payer: Medicare PPO

## 2023-04-09 ENCOUNTER — Ambulatory Visit: Payer: Medicare PPO | Admitting: Internal Medicine

## 2023-04-17 ENCOUNTER — Other Ambulatory Visit: Payer: Self-pay | Admitting: Family

## 2023-04-17 DIAGNOSIS — E559 Vitamin D deficiency, unspecified: Secondary | ICD-10-CM

## 2023-04-21 ENCOUNTER — Other Ambulatory Visit: Payer: Self-pay | Admitting: Family

## 2023-04-21 DIAGNOSIS — E559 Vitamin D deficiency, unspecified: Secondary | ICD-10-CM

## 2023-04-24 ENCOUNTER — Other Ambulatory Visit: Payer: Self-pay | Admitting: Student

## 2023-04-24 ENCOUNTER — Other Ambulatory Visit: Payer: Self-pay | Admitting: Radiology

## 2023-04-24 DIAGNOSIS — D708 Other neutropenia: Secondary | ICD-10-CM

## 2023-04-24 NOTE — H&P (Signed)
 Chief Complaint: Persistent leukocytopenia; referred for image guided bone marrow biopsy for further evaluation   Referring Provider(s): Mohamed,M  Supervising Physician: Oley Balm  Patient Status: [name] - Out-pt  History of Present Illness: Thomas Moody is a 68 y.o. male 68 y.o. male with past medical history significant for osteoarthritis, dyslipidemia, asthma, GERD, enlarged prostate as well as prediabetes. The patient was seen recently at one of the urgent care centers complaining of generalized body ache in December 2024 with subjective fever and fatigue. During his evaluation he had viral panel performed that was negative. Imaging studies including chest x-ray showed no acute cardiopulmonary condition and CT of the abdomen and pelvis was negative. During his evaluation he was found to have leukocytopenia that has been getting worse down to white blood count of 1.9. He was seen by his primary care provider and repeat CBC showed persistent leukocytopenia with total white blood count of 2.1 on 03/12/23.  He is scheduled today for image guided bone marrow biopsy for further evaluation.   *** Patient is Full Code  Past Medical History:  Diagnosis Date   Asthma    GERD (gastroesophageal reflux disease)     Past Surgical History:  Procedure Laterality Date   PILONIDAL CYST EXCISION      Allergies: Patient has no known allergies.  Medications: Prior to Admission medications   Medication Sig Start Date End Date Taking? Authorizing Provider  aspirin 81 MG tablet Take 81 mg by mouth daily.    [provider]  atorvastatin (LIPITOR) 40 MG tablet Take 1 tablet (40 mg total) by mouth daily. 02/14/23   Rema Fendt, NP  CINNAMON PO Take 1 tablet by mouth daily.    [provider]  meloxicam (MOBIC) 7.5 MG tablet Take 1 tablet (7.5 mg total) by mouth daily. 01/11/22   Rema Fendt, NP  predniSONE (STERAPRED UNI-PAK 21 TAB) 10 MG (21) TBPK tablet Take as  directed Patient not taking: Reported on 03/12/2023 08/21/21   Tarry Kos, MD  tamsulosin (FLOMAX) 0.4 MG CAPS capsule Take 1 capsule (0.4 mg total) by mouth daily. 03/27/22   Joline Maxcy, MD  triamcinolone cream (KENALOG) 0.1 % Apply 1 Application topically 2 (two) times daily. 11/27/21   Rema Fendt, NP  Vitamin D, Ergocalciferol, (DRISDOL) 1.25 MG (50000 UNIT) CAPS capsule Take 1 capsule (50,000 Units total) by mouth every 7 (seven) days for 12 doses. 02/17/23 05/06/23  Rema Fendt, NP     Family History  Problem Relation Age of Onset   Heart disease Mother    Dementia Father    Hypertension Sister    Diabetes Son    Diabetes type I Son    Diabetes Paternal Grandmother    Diabetes Son    Diabetes type II Son    Autism Son        middle son    Social History   Socioeconomic History   Marital status: Married    Spouse name: Not on file   Number of children: Not on file   Years of education: Not on file   Highest education level: Associate degree: occupational, Scientist, product/process development, or vocational program  Occupational History   Not on file  Tobacco Use   Smoking status: Never    Passive exposure: Never   Smokeless tobacco: Never  Vaping Use   Vaping status: Never Used  Substance and Sexual Activity   Alcohol use: Not Currently    Alcohol/week: 0.0 standard drinks  of alcohol    Comment: occ   Drug use: No   Sexual activity: Yes  Other Topics Concern   Not on file  Social History Narrative   Married; 3 sons   Social Drivers of Health   Financial Resource Strain: Medium Risk (02/12/2023)   Overall Financial Resource Strain (CARDIA)    Difficulty of Paying Living Expenses: Somewhat hard  Food Insecurity: No Food Insecurity (02/12/2023)   Hunger Vital Sign    Worried About Running Out of Food in the Last Year: Never true    Ran Out of Food in the Last Year: Never true  Transportation Needs: No Transportation Needs (02/12/2023)   PRAPARE - Scientist, research (physical sciences) (Medical): No    Lack of Transportation (Non-Medical): No  Physical Activity: Insufficiently Active (02/12/2023)   Exercise Vital Sign    Days of Exercise per Week: 1 day    Minutes of Exercise per Session: 20 min  Stress: No Stress Concern Present (02/12/2023)   [employer] of Occupational Health - Occupational Stress Questionnaire    Feeling of Stress : Only a little  Social Connections: Moderately Isolated (02/12/2023)   Social Connection and Isolation Panel [NHANES]    Frequency of Communication with Friends and Family: More than three times a week    Frequency of Social Gatherings with Friends and Family: Once a week    Attends Religious Services: Never    Database administrator or Organizations: No    Attends Engineer, structural: Not on file    Marital Status: Married       Review of Systems  Vital Signs:   Advance Care Plan:     Physical Exam  Imaging: No results found.  Labs:  CBC: Recent Labs    01/20/23 2104 02/14/23 1106 03/12/23 1310  WBC 1.9* 3.1* 2.1*  HGB 14.1 14.7 14.4  HCT 41.0 44.8 42.0  PLT 182 234 209    COAGS: No results for input(s): "INR", "APTT" in the last 8760 hours.  BMP: Recent Labs    01/20/23 2104 01/21/23 0249 02/14/23 1106 03/12/23 1310  NA 131* 130* 141 141  K 3.4* 3.3* 4.2 3.6  CL 101 101 104 110  CO2 22 23 25 26   GLUCOSE 106* 98 90 101*  BUN 15 14 10 13   CALCIUM 8.2* 7.7* 9.7 9.2  CREATININE 1.25* 1.06 1.18 1.00  GFRNONAA >60 >60  --  >60    LIVER FUNCTION TESTS: Recent Labs    01/20/23 2104 01/21/23 0249 02/14/23 1106 03/12/23 1310  BILITOT 1.0 1.1 0.6 0.5  AST 156* 124* 23 20  ALT 210* 174* 38 31  ALKPHOS 42 38 75 50  PROT 7.2 6.3* 6.7 6.8  ALBUMIN 3.6 3.1* 4.2 4.1    TUMOR MARKERS: No results for input(s): "AFPTM", "CEA", "CA199", "CHROMGRNA" in the last 8760 hours.  Assessment and Plan: 68 y.o. male 68 y.o. male with past medical history significant for  osteoarthritis, dyslipidemia, asthma, GERD, enlarged prostate as well as prediabetes. The patient was seen recently at one of the urgent care centers complaining of generalized body ache in December 2024 with subjective fever and fatigue. During his evaluation he had viral panel performed that was negative. Imaging studies including chest x-ray showed no acute cardiopulmonary condition and CT of the abdomen and pelvis was negative. During his evaluation he was found to have leukocytopenia that has been getting worse down to white blood count of 1.9. He was seen  by his primary care provider and repeat CBC showed persistent leukocytopenia with total white blood count of 2.1 on 03/12/23.  He is scheduled today for image guided bone marrow biopsy for further evaluation.Risks and benefits of procedure was discussed with the patient including, but not limited to bleeding, infection, damage to adjacent structures or low yield requiring additional tests.  All of the questions were answered and there is agreement to proceed.  Consent signed and in chart.    Thank you for allowing our service to participate in Mansour Balboa 's care.  Electronically Signed: D. Jeananne Rama, PA-C   04/24/2023, 4:41 PM      I spent a total of  15 minutes   in face to face in clinical consultation, greater than 50% of which was counseling/coordinating care for CT guided bone marrow biopsy

## 2023-04-25 ENCOUNTER — Other Ambulatory Visit: Payer: Self-pay

## 2023-04-25 ENCOUNTER — Encounter (HOSPITAL_COMMUNITY): Payer: Self-pay

## 2023-04-25 ENCOUNTER — Ambulatory Visit (HOSPITAL_COMMUNITY)
Admission: RE | Admit: 2023-04-25 | Discharge: 2023-04-25 | Disposition: A | Discharge status: Home / Self Care | Payer: Medicare PPO | Source: Ambulatory Visit | Attending: Physician Assistant | Admitting: Physician Assistant

## 2023-04-25 ENCOUNTER — Ambulatory Visit (HOSPITAL_COMMUNITY)
Admission: RE | Admit: 2023-04-25 | Discharge: 2023-04-25 | Disposition: A | Discharge status: Home / Self Care | Payer: Medicare PPO | Source: Ambulatory Visit | Attending: Internal Medicine | Admitting: Internal Medicine

## 2023-04-25 DIAGNOSIS — M1990 Unspecified osteoarthritis, unspecified site: Secondary | ICD-10-CM | POA: Insufficient documentation

## 2023-04-25 DIAGNOSIS — J45909 Unspecified asthma, uncomplicated: Secondary | ICD-10-CM | POA: Insufficient documentation

## 2023-04-25 DIAGNOSIS — N400 Enlarged prostate without lower urinary tract symptoms: Secondary | ICD-10-CM | POA: Insufficient documentation

## 2023-04-25 DIAGNOSIS — D709 Neutropenia, unspecified: Secondary | ICD-10-CM | POA: Insufficient documentation

## 2023-04-25 DIAGNOSIS — Z1379 Encounter for other screening for genetic and chromosomal anomalies: Secondary | ICD-10-CM | POA: Insufficient documentation

## 2023-04-25 DIAGNOSIS — R7303 Prediabetes: Secondary | ICD-10-CM | POA: Insufficient documentation

## 2023-04-25 DIAGNOSIS — E785 Hyperlipidemia, unspecified: Secondary | ICD-10-CM | POA: Insufficient documentation

## 2023-04-25 DIAGNOSIS — D704 Cyclic neutropenia: Secondary | ICD-10-CM | POA: Diagnosis not present

## 2023-04-25 DIAGNOSIS — D708 Other neutropenia: Secondary | ICD-10-CM

## 2023-04-25 DIAGNOSIS — D72819 Decreased white blood cell count, unspecified: Secondary | ICD-10-CM | POA: Diagnosis not present

## 2023-04-25 LAB — CBC WITH DIFFERENTIAL/PLATELET
Abs Immature Granulocytes: 0.01 10*3/uL (ref 0.00–0.07)
Basophils Absolute: 0 10*3/uL (ref 0.0–0.1)
Basophils Relative: 2 %
Eosinophils Absolute: 0.3 10*3/uL (ref 0.0–0.5)
Eosinophils Relative: 12 %
HCT: 42.3 % (ref 39.0–52.0)
Hemoglobin: 13.8 g/dL (ref 13.0–17.0)
Immature Granulocytes: 0 %
Lymphocytes Relative: 27 %
Lymphs Abs: 0.6 10*3/uL — ABNORMAL LOW (ref 0.7–4.0)
MCH: 29.2 pg (ref 26.0–34.0)
MCHC: 32.6 g/dL (ref 30.0–36.0)
MCV: 89.6 fL (ref 80.0–100.0)
Monocytes Absolute: 0.4 10*3/uL (ref 0.1–1.0)
Monocytes Relative: 18 %
Neutro Abs: 1 10*3/uL — ABNORMAL LOW (ref 1.7–7.7)
Neutrophils Relative %: 41 %
Platelets: 238 10*3/uL (ref 150–400)
RBC: 4.72 MIL/uL (ref 4.22–5.81)
RDW: 14 % (ref 11.5–15.5)
WBC: 2.3 10*3/uL — ABNORMAL LOW (ref 4.0–10.5)
nRBC: 0 % (ref 0.0–0.2)

## 2023-04-25 MED ORDER — MIDAZOLAM HCL 2 MG/2ML IJ SOLN
INTRAMUSCULAR | Status: AC
  Filled 2023-04-25: qty 4

## 2023-04-25 MED ORDER — FENTANYL CITRATE (PF) 100 MCG/2ML IJ SOLN
INTRAMUSCULAR | Status: AC | PRN
  Administered 2023-04-25: 50 ug via INTRAVENOUS

## 2023-04-25 MED ORDER — MIDAZOLAM HCL 2 MG/2ML IJ SOLN
INTRAMUSCULAR | Status: AC | PRN
  Administered 2023-04-25 (×2): 1 mg via INTRAVENOUS

## 2023-04-25 MED ORDER — SODIUM CHLORIDE 0.9 % IV SOLN: INTRAVENOUS | Status: DC

## 2023-04-25 MED ORDER — FENTANYL CITRATE (PF) 100 MCG/2ML IJ SOLN
INTRAMUSCULAR | Status: AC
  Filled 2023-04-25: qty 2

## 2023-04-25 MED ORDER — HYDROCODONE-ACETAMINOPHEN 5-325 MG PO TABS: 1.0000 | ORAL_TABLET | ORAL | Status: DC | PRN

## 2023-04-25 NOTE — Discharge Instructions (Signed)
 Bone Marrow Aspiration and Bone Marrow Biopsy, Adult, Care After  The following information offers guidance on how to care for yourself after your procedure. Your health care provider may also give you more specific instructions. If you have problems or questions, contact your health care provider.  What can I expect after the procedure?  May remove dressing or bandaid and shower tomorrow.  Keep site clean and dry. Replace with clean dressing or bandaid as necessary. Urgent needs IR clinic [phone] (mon-fri 8-5).  After the procedure, it is common to have: Mild pain and tenderness. Swelling. Bruising. Follow these instructions at home: Incision care  Follow instructions from your health care provider about how to take care of the incision site. Make sure you: Wash your hands with soap and water for at least 20 seconds before and after you change your bandage (dressing). If soap and water are not available, use hand sanitizer. Change your dressing as told by your health care provider. Leave stitches (sutures), skin glue, or adhesive strips in place. These skin closures may need to stay in place for 2 weeks or longer. If adhesive strip edges start to loosen and curl up, you may trim the loose edges. Do not remove adhesive strips completely unless your health care provider tells you to do that. Check your incision site every day for signs of infection. Check for: More redness, swelling, or pain. Fluid or blood. Warmth. Pus or a bad smell. Activity Return to your normal activities as told by your health care provider. Ask your health care provider what activities are safe for you. Do not lift anything that is heavier than 10 lb (4.5 kg), or the limit that you are told, until your health care provider says that it is safe. If you were given a sedative during the procedure, it can affect you for several hours. Do not drive or operate machinery until your health care provider says that it is  safe. General instructions  Take over-the-counter and prescription medicines only as told by your health care provider. Do not take baths, swim, or use a hot tub until your health care provider approves. Ask your health care provider if you may take showers. You may only be allowed to take sponge baths. If directed, put ice on the affected area. To do this: Put ice in a plastic bag. Place a towel between your skin and the bag. Leave the ice on for 20 minutes, 2-3 times a day. If your skin turns bright red, remove the ice right away to prevent skin damage. The risk of skin damage is higher if you cannot feel pain, heat, or cold. Contact a health care provider if: You have signs of infection. Your pain is not controlled with medicine. You have cancer, and a temperature of 100.22F (38C) or higher. Get help right away if: You have a temperature of 101F (38.3C) or higher, or as told by your health care provider. You have bleeding from the incision site that cannot be controlled. This information is not intended to replace advice given to you by your health care provider. Make sure you discuss any questions you have with your health care provider. Document Revised: 06/04/2021 Document Reviewed: 06/04/2021 Elsevier Patient Education  2023 Elsevier Inc.     Moderate Conscious Sedation, Adult, Care After  This sheet gives you information about how to care for yourself after your procedure. Your health care provider may also give you more specific instructions. If you have problems or questions, contact  your health care provider. What can I expect after the procedure? After the procedure, it is common to have: Sleepiness for several hours. Impaired judgment for several hours. Difficulty with balance. Vomiting if you eat too soon. Follow these instructions at home: For the time period you were told by your health care provider:     Rest. Do not participate in activities where you  could fall or become injured. Do not drive or use machinery. Do not drink alcohol. Do not take sleeping pills or medicines that cause drowsiness. Do not make important decisions or sign legal documents. Do not take care of children on your own. Eating and drinking  Follow the diet recommended by your health care provider. Drink enough fluid to keep your urine pale yellow. If you vomit: Drink water, juice, or soup when you can drink without vomiting. Make sure you have little or no nausea before eating solid foods. General instructions Take over-the-counter and prescription medicines only as told by your health care provider. Have a responsible adult stay with you for the time you are told. It is important to have someone help care for you until you are awake and alert. Do not smoke. Keep all follow-up visits as told by your health care provider. This is important. Contact a health care provider if: You are still sleepy or having trouble with balance after 24 hours. You feel light-headed. You keep feeling nauseous or you keep vomiting. You develop a rash. You have a fever. You have redness or swelling around the IV site. Get help right away if: You have trouble breathing. You have new-onset confusion at home. Summary After the procedure, it is common to feel sleepy, have impaired judgment, or feel nauseous if you eat too soon. Rest after you get home. Know the things you should not do after the procedure. Follow the diet recommended by your health care provider and drink enough fluid to keep your urine pale yellow. Get help right away if you have trouble breathing or new-onset confusion at home. This information is not intended to replace advice given to you by your health care provider. Make sure you discuss any questions you have with your health care provider. Document Revised: 05/28/2019 Document Reviewed: 12/24/2018 Elsevier Patient Education  2023 [employer].

## 2023-04-25 NOTE — Procedures (Signed)
  Procedure:  CT bone marrow biopsy R iliac Preprocedure diagnosis: Diagnoses of Neutropenia, unspecified type (HCC) and Other neutropenia (HCC) were pertinent to this visit. Postprocedure diagnosis: same EBL:    minimal Complications:   none immediate  See full dictation in [employer].  Thora Lance MD Main # [phone] Pager  [phone] Mobile [phone]

## 2023-04-28 LAB — SURGICAL PATHOLOGY

## 2023-05-01 ENCOUNTER — Other Ambulatory Visit: Payer: Self-pay | Admitting: Physician Assistant

## 2023-05-01 ENCOUNTER — Inpatient Hospital Stay (HOSPITAL_BASED_OUTPATIENT_CLINIC_OR_DEPARTMENT_OTHER): Payer: Medicare PPO | Admitting: Internal Medicine

## 2023-05-01 ENCOUNTER — Inpatient Hospital Stay: Payer: Medicare PPO | Attending: Physician Assistant

## 2023-05-01 VITALS — BP 128/67 | HR 71 | Temp 98.0°F | Resp 17 | Ht 70.0 in | Wt 222.6 lb

## 2023-05-01 DIAGNOSIS — Z79899 Other long term (current) drug therapy: Secondary | ICD-10-CM | POA: Insufficient documentation

## 2023-05-01 DIAGNOSIS — D708 Other neutropenia: Secondary | ICD-10-CM | POA: Diagnosis not present

## 2023-05-01 DIAGNOSIS — D72819 Decreased white blood cell count, unspecified: Secondary | ICD-10-CM

## 2023-05-01 DIAGNOSIS — D709 Neutropenia, unspecified: Secondary | ICD-10-CM | POA: Diagnosis not present

## 2023-05-01 LAB — CBC WITH DIFFERENTIAL (CANCER CENTER ONLY)
Abs Immature Granulocytes: 0.01 10*3/uL (ref 0.00–0.07)
Basophils Absolute: 0.1 10*3/uL (ref 0.0–0.1)
Basophils Relative: 2 %
Eosinophils Absolute: 0.2 10*3/uL (ref 0.0–0.5)
Eosinophils Relative: 10 %
HCT: 41.9 % (ref 39.0–52.0)
Hemoglobin: 14.4 g/dL (ref 13.0–17.0)
Immature Granulocytes: 0 %
Lymphocytes Relative: 28 %
Lymphs Abs: 0.7 10*3/uL (ref 0.7–4.0)
MCH: 30.1 pg (ref 26.0–34.0)
MCHC: 34.4 g/dL (ref 30.0–36.0)
MCV: 87.5 fL (ref 80.0–100.0)
Monocytes Absolute: 0.6 10*3/uL (ref 0.1–1.0)
Monocytes Relative: 23 %
Neutro Abs: 0.9 10*3/uL — ABNORMAL LOW (ref 1.7–7.7)
Neutrophils Relative %: 37 %
Platelet Count: 230 10*3/uL (ref 150–400)
RBC: 4.79 MIL/uL (ref 4.22–5.81)
RDW: 14 % (ref 11.5–15.5)
WBC Count: 2.4 10*3/uL — ABNORMAL LOW (ref 4.0–10.5)
nRBC: 0 % (ref 0.0–0.2)

## 2023-05-01 NOTE — Progress Notes (Signed)
 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  Lipitor could be the culprit drug.  PRIOR THERAPY: None  CURRENT THERAPY: None  INTERVAL HISTORY: Thomas Moody 68 y.o. male returns to the clinic today for follow-up visit. Discussed the use of AI scribe software for clinical note transcription with the patient, who gave verbal consent to proceed.  History of Present Illness   Thomas Moody is a 68 year old male who presents for follow-up evaluation of low white blood cell count.  He has experienced a low white blood cell count for the past several years. Extensive testing, including rheumatoid factor, ANA, hepatitis panel, HIV, folic acid, and vitamin B12 levels, returned normal results. A bone marrow biopsy showed no concerning abnormalities or evidence of leukemia, myelodysplastic, or myeloproliferative disorders. The bone marrow was hypercellular, which is normal for his age, with no significant dyspoiesis or lymphoproliferative process observed.  He is currently taking Lipitor 40 mg for cholesterol management. His white blood cell count was recently measured at 2.4, an improvement from a previous count of 2.1. No issues with healing or frequent infections such as urinary tract infections or upper respiratory infections. No significant symptoms related to his low white blood cell count.       MEDICAL HISTORY: Past Medical History:  Diagnosis Date   Asthma    GERD (gastroesophageal reflux disease)     ALLERGIES:  has no known allergies.  MEDICATIONS:  Current Outpatient Medications  Medication Sig Dispense Refill   aspirin 81 MG tablet Take 81 mg by mouth daily.     atorvastatin (LIPITOR) 40 MG tablet Take 1 tablet (40 mg total) by mouth daily. 90 tablet 0   CINNAMON  PO Take 1 tablet by mouth daily.     meloxicam (MOBIC) 7.5 MG tablet Take 1 tablet (7.5 mg total) by mouth daily. 30 tablet 2   predniSONE (STERAPRED UNI-PAK 21 TAB) 10 MG (21) TBPK tablet Take as directed (Patient not taking: Reported on 02/14/2023) 21 tablet 3   tamsulosin (FLOMAX) 0.4 MG CAPS capsule Take 1 capsule (0.4 mg total) by mouth daily. 90 capsule 3   triamcinolone cream (KENALOG) 0.1 % Apply 1 Application topically 2 (two) times daily. 80 g 0   Vitamin D, Ergocalciferol, (DRISDOL) 1.25 MG (50000 UNIT) CAPS capsule Take 1 capsule (50,000 Units total) by mouth every 7 (seven) days for 12 doses. 12 capsule 0   No current facility-administered medications for this visit.    SURGICAL HISTORY:  Past Surgical History:  Procedure Laterality Date   PILONIDAL CYST EXCISION      REVIEW OF SYSTEMS:  A comprehensive review of systems was negative.   PHYSICAL EXAMINATION: General appearance: alert, cooperative, and no distress Head: Normocephalic, without obvious abnormality, atraumatic Neck: no adenopathy, no JVD, supple, symmetrical, trachea midline, and thyroid not enlarged, symmetric, no tenderness/mass/nodules Lymph nodes: Cervical, supraclavicular, and axillary nodes normal. Resp: clear to auscultation bilaterally Back: symmetric, no curvature. ROM normal. No CVA tenderness. Cardio: regular rate and rhythm, S1, S2 normal, no murmur, click, rub or gallop GI: soft, non-tender; bowel sounds normal; no masses,  no organomegaly Extremities: extremities normal, atraumatic, no cyanosis or edema  ECOG PERFORMANCE STATUS: 0 - Asymptomatic  Blood pressure 128/67, pulse 71, temperature 98 F (36.7 C), temperature source Temporal, resp. rate 17,  height 5\' 10"  (1.778 m), weight 222 lb 9.6 oz (101 kg), SpO2 98%.  LABORATORY DATA: Lab Results  Component Value Date   WBC 2.4 (L) 05/01/2023   HGB 14.4 05/01/2023   HCT 41.9 05/01/2023   MCV 87.5 05/01/2023   PLT 230 05/01/2023       Chemistry      Component Value Date/Time   NA 141 03/12/2023 1310   NA 141 02/14/2023 1106   K 3.6 03/12/2023 1310   CL 110 03/12/2023 1310   CO2 26 03/12/2023 1310   BUN 13 03/12/2023 1310   BUN 10 02/14/2023 1106   CREATININE 1.00 03/12/2023 1310   CREATININE 1.18 04/08/2014 1211      Component Value Date/Time   CALCIUM 9.2 03/12/2023 1310   ALKPHOS 50 03/12/2023 1310   AST 20 03/12/2023 1310   ALT 31 03/12/2023 1310   BILITOT 0.5 03/12/2023 1310       RADIOGRAPHIC STUDIES: CT BONE MARROW BIOPSY & ASPIRATION Result Date: 04/25/2023 CLINICAL DATA:  Neutropenia EXAM: CT GUIDED DEEP ILIAC BONE ASPIRATION AND CORE BIOPSY TECHNIQUE: Patient was placed prone on the CT gantry and limited axial scans through the pelvis were obtained. This exam was performed according to the departmental dose-optimization program which includes automated exposure control, adjustment of the mA and/or kV according to patient size and/or use of iterative reconstruction technique. Appropriate skin entry site was identified. Skin site was marked, prepped with chlorhexidine, draped in usual sterile fashion, and infiltrated locally with 1% lidocaine. Intravenous Fentanyl and Versed 2mg  were administered by RN during a total moderate (conscious) sedation time of 11 minutes; the patient's level of consciousness and physiological / cardiorespiratory status were monitored continuously by radiology RN under my direct supervision. Under CT fluoroscopic guidance an 11-gauge Cook trocar bone needle was advanced into the right iliac bone just lateral to the sacroiliac joint. Once needle tip position was confirmed, core and aspiration samples were obtained, submitted to pathology for approval. Patient tolerated procedure well. COMPLICATIONS: COMPLICATIONS none IMPRESSION: 1. Technically successful CT guided right iliac bone core and aspiration biopsy. Electronically Signed   By: Corlis Leak M.D.   On: 04/25/2023 09:56     ASSESSMENT AND PLAN: Assessment and Plan    Leukopenia Chronic leukopenia with normal workup including rheumatoid factor, ANA, hepatitis panel, HIV, folic acid, vitamin B12, and iron levels. Bone marrow biopsy showed hypercellular marrow without leukemia, myelodysplastic syndrome, myeloproliferative disorder, significant dyspoiesis, or lymphoproliferative process. Suspected drug-induced leukopenia, likely due to atorvastatin, a rare side effect. No frequent infections or healing issues reported, though leukopenia can increase infection risk. - Discuss with primary care physician, Ms. Delfin Gant, NP about reducing atorvastatin dose from 40 mg to 20 mg or 10 mg - Monitor white blood cell count after atorvastatin dose adjustment - Follow up as needed if significant drop in white blood cell count or other issues arise   The patient was advised to call if he has any concerning symptoms. The patient voices understanding of current disease status and treatment options and is in agreement with the current care plan.  All questions were answered. The patient knows to call the clinic with any problems, questions or concerns. We can certainly see the patient much sooner if necessary.  The total time spent in the appointment was 20 minutes.  Disclaimer: This note was dictated with voice recognition software. Similar sounding words can inadvertently be transcribed and may not be corrected upon review.

## 2023-05-06 ENCOUNTER — Encounter (HOSPITAL_COMMUNITY): Payer: Self-pay | Admitting: Internal Medicine

## 2023-05-16 ENCOUNTER — Other Ambulatory Visit: Payer: Self-pay | Admitting: Family

## 2023-05-16 DIAGNOSIS — E785 Hyperlipidemia, unspecified: Secondary | ICD-10-CM

## 2023-06-19 ENCOUNTER — Ambulatory Visit: Payer: Medicare PPO

## 2023-07-08 NOTE — Progress Notes (Signed)
   Assessment: No diagnosis found.   Plan: Continue tamsulosin  Follow-up 1 year or sooner if problems arise  Chief Complaint: LUTS  HPI: Thomas Moody is a 68 y.o. male who presents for continued evaluation of BPH/lower urinary tract symptoms with incomplete bladder emptying. Please see my note 03/26/2022 at the time of initial visit for detailed history.  At that time the patient was started on tamsulosin .  Thomas Moody reports that he has had significant improvement in his lower urinary tract symptoms. IPSS = 7/2 PVR today = 22 mL He is quite pleased with his current status.  Baseline IPSS = 11.  Most bothersome symptoms are urinary frequency and urgency and occasional urge incontinence.  He also gets up approximately 3 times per night.  His quality-of-life score is 3.   PVR 03/2022 = 198 mL    There is no family history of prostate cancer.  The patient's father did have symptomatic BPH   PSA DATA: 03/2014             1.05 02/2018             1.9 03/2022  1.0    Portions of the above documentation were copied from a prior visit for review purposes only.  Allergies: No Known Allergies  PMH: Past Medical History:  Diagnosis Date   Asthma    GERD (gastroesophageal reflux disease)     PSH: Past Surgical History:  Procedure Laterality Date   PILONIDAL CYST EXCISION      SH: Social History   Tobacco Use   Smoking status: Never    Passive exposure: Never   Smokeless tobacco: Never  Vaping Use   Vaping status: Never Used  Substance Use Topics   Alcohol use: Not Currently    Alcohol/week: 0.0 standard drinks of alcohol    Comment: occ   Drug use: No    ROS: Constitutional:  Negative for fever, chills, weight loss CV: Negative for chest pain, previous MI, hypertension Respiratory:  Negative for shortness of breath, wheezing, sleep apnea, frequent cough GI:  Negative for nausea, vomiting, bloody stool, GERD  PE: There were no vitals taken for this  visit. GENERAL APPEARANCE:  Well appearing, well developed, well nourished, NAD    Results: No results found for this or any previous visit (from the past 24 hours).

## 2023-07-09 ENCOUNTER — Ambulatory Visit (INDEPENDENT_AMBULATORY_CARE_PROVIDER_SITE_OTHER): Payer: Medicare PPO | Admitting: Urology

## 2023-07-09 ENCOUNTER — Encounter: Payer: Self-pay | Admitting: Urology

## 2023-07-09 VITALS — BP 117/71 | HR 87 | Ht 70.0 in | Wt 220.0 lb

## 2023-07-09 DIAGNOSIS — N401 Enlarged prostate with lower urinary tract symptoms: Secondary | ICD-10-CM

## 2023-07-09 DIAGNOSIS — R399 Unspecified symptoms and signs involving the genitourinary system: Secondary | ICD-10-CM

## 2023-07-09 LAB — URINALYSIS, ROUTINE W REFLEX MICROSCOPIC
Bilirubin, UA: NEGATIVE
Glucose, UA: NEGATIVE
Ketones, UA: NEGATIVE
Leukocytes,UA: NEGATIVE
Nitrite, UA: NEGATIVE
Protein,UA: NEGATIVE
Specific Gravity, UA: 1.02 (ref 1.005–1.030)
Urobilinogen, Ur: 0.2 mg/dL (ref 0.2–1.0)
pH, UA: 5.5 (ref 5.0–7.5)

## 2023-07-09 LAB — MICROSCOPIC EXAMINATION: Bacteria, UA: NONE SEEN

## 2023-07-09 LAB — BLADDER SCAN AMB NON-IMAGING: Scan Result: 43

## 2023-11-06 ENCOUNTER — Ambulatory Visit

## 2023-11-11 DIAGNOSIS — Z79899 Other long term (current) drug therapy: Secondary | ICD-10-CM | POA: Diagnosis not present

## 2023-11-11 DIAGNOSIS — R7303 Prediabetes: Secondary | ICD-10-CM | POA: Diagnosis not present

## 2023-11-11 DIAGNOSIS — Z136 Encounter for screening for cardiovascular disorders: Secondary | ICD-10-CM | POA: Diagnosis not present

## 2023-11-11 DIAGNOSIS — E559 Vitamin D deficiency, unspecified: Secondary | ICD-10-CM | POA: Diagnosis not present

## 2023-11-11 DIAGNOSIS — N401 Enlarged prostate with lower urinary tract symptoms: Secondary | ICD-10-CM | POA: Diagnosis not present

## 2023-11-11 DIAGNOSIS — E785 Hyperlipidemia, unspecified: Secondary | ICD-10-CM | POA: Diagnosis not present

## 2023-11-11 DIAGNOSIS — E669 Obesity, unspecified: Secondary | ICD-10-CM | POA: Diagnosis not present

## 2023-11-11 DIAGNOSIS — Z125 Encounter for screening for malignant neoplasm of prostate: Secondary | ICD-10-CM | POA: Diagnosis not present

## 2023-11-11 DIAGNOSIS — D709 Neutropenia, unspecified: Secondary | ICD-10-CM | POA: Diagnosis not present

## 2023-11-11 DIAGNOSIS — D72819 Decreased white blood cell count, unspecified: Secondary | ICD-10-CM | POA: Diagnosis not present

## 2023-12-03 DIAGNOSIS — Z0001 Encounter for general adult medical examination with abnormal findings: Secondary | ICD-10-CM | POA: Diagnosis not present

## 2023-12-03 DIAGNOSIS — Z7189 Other specified counseling: Secondary | ICD-10-CM | POA: Diagnosis not present

## 2023-12-03 DIAGNOSIS — Z6831 Body mass index (BMI) 31.0-31.9, adult: Secondary | ICD-10-CM | POA: Diagnosis not present

## 2023-12-03 DIAGNOSIS — D72819 Decreased white blood cell count, unspecified: Secondary | ICD-10-CM | POA: Diagnosis not present

## 2023-12-03 DIAGNOSIS — R252 Cramp and spasm: Secondary | ICD-10-CM | POA: Diagnosis not present

## 2023-12-03 DIAGNOSIS — E559 Vitamin D deficiency, unspecified: Secondary | ICD-10-CM | POA: Diagnosis not present

## 2023-12-03 DIAGNOSIS — E669 Obesity, unspecified: Secondary | ICD-10-CM | POA: Diagnosis not present

## 2023-12-03 DIAGNOSIS — Z23 Encounter for immunization: Secondary | ICD-10-CM | POA: Diagnosis not present

## 2023-12-03 DIAGNOSIS — E785 Hyperlipidemia, unspecified: Secondary | ICD-10-CM | POA: Diagnosis not present

## 2024-03-23 ENCOUNTER — Ambulatory Visit (HOSPITAL_COMMUNITY)
